# Patient Record
Sex: Female | Born: 1960 | Race: Black or African American | Hispanic: No | Marital: Married | Smoking: Never smoker
Health system: Southern US, Community
[De-identification: ages and names within clinical notes are randomized; demographics above are authoritative.]

## PROBLEM LIST (undated history)

## (undated) DIAGNOSIS — M25474 Effusion, right foot: Secondary | ICD-10-CM

## (undated) DIAGNOSIS — T7840XA Allergy, unspecified, initial encounter: Secondary | ICD-10-CM

## (undated) DIAGNOSIS — D219 Benign neoplasm of connective and other soft tissue, unspecified: Secondary | ICD-10-CM

## (undated) DIAGNOSIS — R42 Dizziness and giddiness: Secondary | ICD-10-CM

## (undated) DIAGNOSIS — G4700 Insomnia, unspecified: Secondary | ICD-10-CM

## (undated) DIAGNOSIS — M25472 Effusion, left ankle: Secondary | ICD-10-CM

## (undated) DIAGNOSIS — M25471 Effusion, right ankle: Secondary | ICD-10-CM

## (undated) DIAGNOSIS — M25475 Effusion, left foot: Secondary | ICD-10-CM

## (undated) DIAGNOSIS — K429 Umbilical hernia without obstruction or gangrene: Secondary | ICD-10-CM

## (undated) DIAGNOSIS — M25476 Effusion, unspecified foot: Secondary | ICD-10-CM

## (undated) DIAGNOSIS — E039 Hypothyroidism, unspecified: Secondary | ICD-10-CM

## (undated) DIAGNOSIS — D649 Anemia, unspecified: Secondary | ICD-10-CM

## (undated) DIAGNOSIS — I499 Cardiac arrhythmia, unspecified: Secondary | ICD-10-CM

## (undated) DIAGNOSIS — E079 Disorder of thyroid, unspecified: Secondary | ICD-10-CM

## (undated) DIAGNOSIS — M25473 Effusion, unspecified ankle: Secondary | ICD-10-CM

## (undated) HISTORY — PX: ABDOMINAL HYSTERECTOMY: SHX81

## (undated) HISTORY — PX: COLONOSCOPY: SHX174

## (undated) HISTORY — PX: OTHER SURGICAL HISTORY: SHX169

## (undated) HISTORY — DX: Anemia, unspecified: D64.9

## (undated) HISTORY — PX: DENTAL SURGERY: SHX609

## (undated) HISTORY — PX: HERNIA REPAIR: SHX51

## (undated) HISTORY — PX: DILATION AND CURETTAGE OF UTERUS: SHX78

## (undated) HISTORY — DX: Allergy, unspecified, initial encounter: T78.40XA

## (undated) HISTORY — DX: Disorder of thyroid, unspecified: E07.9

---

## 1999-09-21 ENCOUNTER — Other Ambulatory Visit: Admission: RE | Admit: 1999-09-21 | Discharge: 1999-09-21 | Payer: Self-pay | Admitting: Obstetrics and Gynecology

## 2000-08-09 ENCOUNTER — Emergency Department (HOSPITAL_COMMUNITY): Admission: EM | Admit: 2000-08-09 | Discharge: 2000-08-09 | Payer: Self-pay | Admitting: Emergency Medicine

## 2000-09-20 ENCOUNTER — Other Ambulatory Visit: Admission: RE | Admit: 2000-09-20 | Discharge: 2000-09-20 | Payer: Self-pay | Admitting: *Deleted

## 2001-11-09 ENCOUNTER — Other Ambulatory Visit: Admission: RE | Admit: 2001-11-09 | Discharge: 2001-11-09 | Payer: Self-pay | Admitting: Obstetrics and Gynecology

## 2002-11-11 ENCOUNTER — Other Ambulatory Visit: Admission: RE | Admit: 2002-11-11 | Discharge: 2002-11-11 | Payer: Self-pay | Admitting: Obstetrics and Gynecology

## 2006-12-19 ENCOUNTER — Ambulatory Visit (HOSPITAL_BASED_OUTPATIENT_CLINIC_OR_DEPARTMENT_OTHER): Admission: RE | Admit: 2006-12-19 | Discharge: 2006-12-19 | Payer: Self-pay | Admitting: Urology

## 2009-05-20 ENCOUNTER — Ambulatory Visit: Payer: Self-pay | Admitting: Internal Medicine

## 2009-05-20 DIAGNOSIS — E039 Hypothyroidism, unspecified: Secondary | ICD-10-CM | POA: Insufficient documentation

## 2009-05-20 DIAGNOSIS — R002 Palpitations: Secondary | ICD-10-CM | POA: Insufficient documentation

## 2009-05-21 ENCOUNTER — Encounter: Payer: Self-pay | Admitting: Internal Medicine

## 2009-06-10 ENCOUNTER — Ambulatory Visit: Payer: Self-pay

## 2010-06-20 LAB — CONVERTED CEMR LAB
ALT: 20 units/L (ref 0–35)
AST: 20 units/L (ref 0–37)
BUN: 8 mg/dL (ref 6–23)
Basophils Absolute: 0 10*3/uL (ref 0.0–0.1)
Bilirubin, Direct: 0 mg/dL (ref 0.0–0.3)
Calcium: 9.9 mg/dL (ref 8.4–10.5)
Creatinine, Ser: 0.7 mg/dL (ref 0.4–1.2)
Eosinophils Relative: 2.3 % (ref 0.0–5.0)
GFR calc non Af Amer: 115.69 mL/min (ref >60)
Glucose, Bld: 91 mg/dL (ref 70–99)
HCT: 32.2 % — ABNORMAL LOW (ref 36.0–46.0)
Lymphs Abs: 1.8 10*3/uL (ref 0.7–4.0)
Monocytes Absolute: 0.3 10*3/uL (ref 0.1–1.0)
Monocytes Relative: 6.2 % (ref 3.0–12.0)
Neutrophils Relative %: 58.1 % (ref 43.0–77.0)
Platelets: 218 10*3/uL (ref 150.0–400.0)
RDW: 15.8 % — ABNORMAL HIGH (ref 11.5–14.6)
TSH: 0.44 microintl units/mL (ref 0.35–5.50)
Total Bilirubin: 0.9 mg/dL (ref 0.3–1.2)
WBC: 5.3 10*3/uL (ref 4.5–10.5)

## 2010-10-05 NOTE — Op Note (Signed)
Sandra Yates, Sandra Yates                ACCOUNT NO.:  [id number]   MEDICAL RECORD NO.:  [id number]          PATIENT TYPE:  AMB   LOCATION:  [facility]                         FACILITY:  [facility]   PHYSICIAN:  Martina Sinner, MD DATE OF BIRTH:  01/18/1961   DATE OF PROCEDURE:  12/19/2006  DATE OF DISCHARGE:                               OPERATIVE REPORT   PREOP DIAGNOSIS:  Pelvic pain.   POSTOPERATIVE DIAGNOSES:  1. Pelvic pain.  2. Interstitial cystitis.  3. Mild urethral stenosis.   SURGERY:  1. Cystoscopy.  2. Hydrodistention bladder installation therapy.  3. Urethral dilation.   HISTORY:  Ms. [name] has urinary frequency and mild stress and  urge incontinence.  Based upon her urodynamics; and upon re-questioning  her, I felt that she may have interstitial cystitis.  Her symptoms are  worse around the time of her menstrual cycle.  There is a question  whether or not her large uterus with a fibroid was the cause of some of  her symptoms.   Today, she underwent cystoscopy and bladder hydrodistention.   DESCRIPTION OF PROCEDURE:  Today I examined her with a 21-French  cystoscope.  Even with the obturator, I could not initially insert the  scope through the urethral meatus.  In retrospect, I had trouble the  office with the same.  I gently dilated her from 16-to-22-French.  The  cystoscope with obturator then went in very nicely.   I then inspected the bladder; and the bladder mucosa and trigone were  normal.  There was no stitch, foreign body, or carcinoma.  Surprisingly,  I could only hydrodistend her to approximately 450 mL.  I emptied the  bladder, and reexamined her.  There were diffuse glomerulations  throughout the entire bladder.  There is no ulcers.  She had a little  bit of bladder bleeding.  Her bladder was emptied.   I then instilled 25 mL of 0.5% Marcaine in combination with 400 mg of  peridium.   The patient was given.  Preoperative antibiotics.  The patient was  taken  to recovery room.   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  I do not think she  should have a hysterectomy at this point in time for these symptoms.  It  may be an aggravating factor; but, I think, for her to have a  hysterectomy there should be other reasons to have one.  I will check to  see whether or not behavioral therapy; and Vesicare helped her which was  prescribed on November 06, 2006.  Detrol given for a month helped several  months ago, but the effect gradually wore off.   I am going to aggressively manage Ms. [name]' symptoms with behavioral  medical therapy and interstitial cystitis therapy.  I will send a known  to Dr. Richardean Sale.           ______________________________  Martina Sinner, MD  Electronically Signed     SAM/MEDQ  D:  12/19/2006  T:  12/19/2006  Job:  [phone]  cc:   Richardean Sale, M.D.  Fax: [phone]

## 2011-03-07 ENCOUNTER — Ambulatory Visit (INDEPENDENT_AMBULATORY_CARE_PROVIDER_SITE_OTHER): Payer: Self-pay

## 2011-03-07 ENCOUNTER — Inpatient Hospital Stay (INDEPENDENT_AMBULATORY_CARE_PROVIDER_SITE_OTHER)
Admission: RE | Admit: 2011-03-07 | Discharge: 2011-03-07 | Disposition: A | Discharge status: Home / Self Care | Payer: Self-pay | Source: Ambulatory Visit | Attending: Family Medicine | Admitting: Family Medicine

## 2011-03-07 DIAGNOSIS — M79609 Pain in unspecified limb: Secondary | ICD-10-CM

## 2011-03-07 LAB — POCT HEMOGLOBIN-HEMACUE
Hemoglobin: 8.8 — ABNORMAL LOW
Operator id: 268271

## 2011-03-07 LAB — URIC ACID: Uric Acid, Serum: 4.6 mg/dL (ref 2.4–7.0)

## 2011-03-07 LAB — POCT PREGNANCY, URINE: Operator id: 268271

## 2012-03-07 ENCOUNTER — Ambulatory Visit (INDEPENDENT_AMBULATORY_CARE_PROVIDER_SITE_OTHER): Payer: PRIVATE HEALTH INSURANCE | Admitting: Family Medicine

## 2012-03-07 ENCOUNTER — Encounter: Payer: Self-pay | Admitting: Family Medicine

## 2012-03-07 VITALS — BP 125/82 | HR 90 | Temp 97.9°F | Wt 134.0 lb

## 2012-03-07 DIAGNOSIS — D649 Anemia, unspecified: Secondary | ICD-10-CM

## 2012-03-07 DIAGNOSIS — Z13220 Encounter for screening for lipoid disorders: Secondary | ICD-10-CM

## 2012-03-07 DIAGNOSIS — Z833 Family history of diabetes mellitus: Secondary | ICD-10-CM

## 2012-03-07 DIAGNOSIS — N926 Irregular menstruation, unspecified: Secondary | ICD-10-CM

## 2012-03-07 DIAGNOSIS — E039 Hypothyroidism, unspecified: Secondary | ICD-10-CM

## 2012-03-07 DIAGNOSIS — Z87410 Personal history of cervical dysplasia: Secondary | ICD-10-CM

## 2012-03-07 MED ORDER — LEVOTHYROXINE SODIUM 50 MCG PO TABS: 50.0000 ug | ORAL_TABLET | Freq: Every day | ORAL | Status: DC

## 2012-03-07 NOTE — Patient Instructions (Addendum)
Sent refill for levothyroxine to your pharmacy  Will check your hemoglobin today  Follow-up in 6 weeks for annual gynecological exam and fasting bloodwork  (fasting is no food or drink except water and medicines for 6-8 hours)  Nice to meet you!

## 2012-03-07 NOTE — Assessment & Plan Note (Signed)
Agree likley contributed by fibroids and perimenopausal.  WIll restart TSH supplemetation, follow-up in 6 wek sat her annual gyn exam

## 2012-03-07 NOTE — Assessment & Plan Note (Signed)
Will refill at her stable dose of levothyroxine 50 mcg.  Will recheck in 6 weeks

## 2012-03-07 NOTE — Assessment & Plan Note (Signed)
11.9.  Continue once daily iron supplement.

## 2012-03-07 NOTE — Progress Notes (Signed)
  Subjective:    Patient ID: Sandra Yates, female    DOB: 1960/12/26, 51 y.o.   MRN: [id number]  HPI"pronounced "Sha-lay"  Here to establish as new patient.  Hypothyroidism:  Has radioiodine therapy in 20's.  Has been on stable levothyroxine dose of 50 mcg for years.  Has been out and needed refill.  Has not taken for 1 month.  Has had some dizziness and skin changes since then.  Anemia:  Notes was found to be anemic earlier this year.  Has iron supplements, taking occasionally.  Reports heavy periods every 21 days.  States previous physician states was likely due to fibroids in perimenopause.    I have reviewed patient's  PMH, FH, and Social history and Medications as related to this visit.  Patient Information Form: Screening and ROS  AUDIT-C Score: 0 Do you feel safe in relationships? yes PHQ-2:negative  Review of Symptoms  General:  Negative for nexplained weight loss, fever Skin: Negative for new or changing mole, sore that won't heal HEENT: Negative for trouble hearing, trouble seeing, ringing in ears, mouth sores, hoarseness, change in voice, dysphagia. CV:  Negative for chest pain, dyspnea, edema, palpitations Resp: Negative for cough, dyspnea, hemoptysis GI: Negative for nausea, vomiting, diarrhea, constipation, abdominal pain, melena, hematochezia. GU: Negative for dysuria, incontinence, urinary hesitance, hematuria, vaginal or penile discharge, polyuria, sexual difficulty, lumps in testicle or breasts MSK: Negative for muscle cramps or aches, joint pain or swelling Neuro: Negative for headaches, weakness, numbness, dizziness, passing out/fainting Psych: Negative for depression, anxiety, memory problems  Positive for dizziness, muscle cramps, vision changes. Review of Systems see HPI   Objective:   Physical Exam GEN: Alert & Oriented, No acute distress CV:  Regular Rate & Rhythm, no murmur Respiratory:  Normal work of breathing, CTAB Abd:  + BS, soft, no tenderness  to palpation Ext: no pre-tibial edema        Assessment & Plan:

## 2012-04-05 ENCOUNTER — Other Ambulatory Visit (HOSPITAL_COMMUNITY)
Admission: RE | Admit: 2012-04-05 | Discharge: 2012-04-05 | Disposition: A | Discharge status: Home / Self Care | Payer: PRIVATE HEALTH INSURANCE | Source: Ambulatory Visit | Attending: Family Medicine | Admitting: Family Medicine

## 2012-04-05 ENCOUNTER — Ambulatory Visit (INDEPENDENT_AMBULATORY_CARE_PROVIDER_SITE_OTHER): Payer: PRIVATE HEALTH INSURANCE | Admitting: Family Medicine

## 2012-04-05 ENCOUNTER — Other Ambulatory Visit (HOSPITAL_COMMUNITY)
Admission: RE | Admit: 2012-04-05 | Discharge: 2012-04-05 | Disposition: A | Discharge status: Home / Self Care | Payer: PRIVATE HEALTH INSURANCE | Source: Ambulatory Visit | Attending: Emergency Medicine | Admitting: Emergency Medicine

## 2012-04-05 ENCOUNTER — Encounter: Payer: Self-pay | Admitting: Family Medicine

## 2012-04-05 VITALS — BP 118/86 | HR 105 | Ht 66.0 in | Wt 138.0 lb

## 2012-04-05 DIAGNOSIS — Z01419 Encounter for gynecological examination (general) (routine) without abnormal findings: Secondary | ICD-10-CM | POA: Insufficient documentation

## 2012-04-05 DIAGNOSIS — E039 Hypothyroidism, unspecified: Secondary | ICD-10-CM

## 2012-04-05 DIAGNOSIS — N760 Acute vaginitis: Secondary | ICD-10-CM

## 2012-04-05 DIAGNOSIS — Z124 Encounter for screening for malignant neoplasm of cervix: Secondary | ICD-10-CM

## 2012-04-05 DIAGNOSIS — Z113 Encounter for screening for infections with a predominantly sexual mode of transmission: Secondary | ICD-10-CM | POA: Insufficient documentation

## 2012-04-05 DIAGNOSIS — Z1151 Encounter for screening for human papillomavirus (HPV): Secondary | ICD-10-CM | POA: Insufficient documentation

## 2012-04-05 NOTE — Patient Instructions (Addendum)
Due for mammogram- see info  Will check thyroid level today  [email or web address]  Follow-up yearly

## 2012-04-05 NOTE — Progress Notes (Signed)
  Subjective:    Patient ID: Sandra Yates, female    DOB: [birth year], 51 y.o.   MRN: [id number]  HPI  Annual Gynecological Exam  Wt Readings from Last 3 Encounters:  04/05/12 138 lb (62.596 kg)  03/07/12 134 lb (60.782 kg)  05/20/09 134 lb 4 oz (60.895 kg)   Last period: 02/26/2012 Regular periods: peri-meniopausal Heavy bleeding: no  Sexually active: yes Birth control or hormonal therapy: vasectomy Hx of STD: Patient desires STD screening Dyspareunia:yes- no pressure, some dryness, ad feels like vaginal skin pulling, uses some lubrication Hot flashes: No Vaginal discharge: No Dysuria:No   Last mammogram: never had one Breast mass or concerns: No  Last Pap:  normal since then History of abnormal pap: many years ago - 20-25 years ago  FH of breast, uterine, ovarian, colon cancer: No    Review of Systems    see HPI Objective:   Physical Exam GEN: Alert & Oriented, No acute distress CV:  Regular Rate & Rhythm, no murmur Respiratory:  Normal work of breathing, CTAB Abd:  + BS, soft, no tenderness to palpation Ext: no pre-tibial edema Pelvic Exam:        External: normal female genitalia without lesions or masses        Vagina: normal without lesions or masses- vagina looks well estrogenized        Cervix: normal without lesions or masses        Adnexa: normal bimanual exam without masses or fullness        Uterus: normal by palpation        Pap smear: performed Breast: wnl       Assessment & Plan:  Preventive services reviewed- patient given info on mammogram.  Hypothyroidism: will check today 6 weeks after re-starting Follow-up annually

## 2012-04-09 ENCOUNTER — Other Ambulatory Visit: Payer: PRIVATE HEALTH INSURANCE

## 2012-04-09 DIAGNOSIS — E039 Hypothyroidism, unspecified: Secondary | ICD-10-CM

## 2012-04-09 DIAGNOSIS — D649 Anemia, unspecified: Secondary | ICD-10-CM

## 2012-04-09 DIAGNOSIS — Z833 Family history of diabetes mellitus: Secondary | ICD-10-CM

## 2012-04-09 DIAGNOSIS — Z13220 Encounter for screening for lipoid disorders: Secondary | ICD-10-CM

## 2012-04-09 LAB — COMPREHENSIVE METABOLIC PANEL
ALT: 13 U/L (ref 0–35)
CO2: 29 mEq/L (ref 19–32)
Creat: 0.65 mg/dL (ref 0.50–1.10)
Total Bilirubin: 0.7 mg/dL (ref 0.3–1.2)

## 2012-04-09 LAB — CBC
HCT: 37.6 % (ref 36.0–46.0)
Hemoglobin: 12.4 g/dL (ref 12.0–15.0)
MCV: 90.2 fL (ref 78.0–100.0)
RBC: 4.17 MIL/uL (ref 3.87–5.11)
RDW: 15.7 % — ABNORMAL HIGH (ref 11.5–15.5)
WBC: 4.6 10*3/uL (ref 4.0–10.5)

## 2012-04-09 LAB — LIPID PANEL
HDL: 47 mg/dL (ref >39)
LDL Cholesterol: 129 mg/dL — ABNORMAL HIGH (ref 0–99)
Total CHOL/HDL Ratio: 4.3 Ratio
VLDL: 27 mg/dL (ref 0–40)

## 2012-04-09 LAB — TSH: TSH: 1.176 u[IU]/mL (ref 0.350–4.500)

## 2012-04-09 NOTE — Progress Notes (Signed)
TSH,CMP,FLP AND CBC DONE TODAY Sandra Yates 

## 2012-04-10 ENCOUNTER — Encounter: Payer: Self-pay | Admitting: Family Medicine

## 2012-07-07 ENCOUNTER — Other Ambulatory Visit: Payer: Self-pay

## 2012-10-05 ENCOUNTER — Ambulatory Visit (INDEPENDENT_AMBULATORY_CARE_PROVIDER_SITE_OTHER): Payer: PRIVATE HEALTH INSURANCE | Admitting: Family Medicine

## 2012-10-05 ENCOUNTER — Encounter: Payer: Self-pay | Admitting: Family Medicine

## 2012-10-05 VITALS — BP 117/80 | HR 96 | Ht 66.0 in | Wt 140.0 lb

## 2012-10-05 DIAGNOSIS — D649 Anemia, unspecified: Secondary | ICD-10-CM

## 2012-10-05 DIAGNOSIS — E039 Hypothyroidism, unspecified: Secondary | ICD-10-CM

## 2012-10-05 LAB — TSH: TSH: 4.918 u[IU]/mL — ABNORMAL HIGH (ref 0.350–4.500)

## 2012-10-05 NOTE — Assessment & Plan Note (Signed)
Resolved at last check.  Advised may discontinue iron supplements

## 2012-10-05 NOTE — Patient Instructions (Addendum)
Good to see you-  You are doing great!  Will send you letter with normal thyroid results, will call if abnormal  Follow-up yearly- last physical was 04/05/12

## 2012-10-05 NOTE — Assessment & Plan Note (Addendum)
Will check TSh today  Update:  tsh elevated.  Will increase from 50 mcg to 75 mcg.  Recheck in 8 weeks

## 2012-10-05 NOTE — Progress Notes (Signed)
  Subjective:    Patient ID: Sandra Yates, female    DOB: February 27, 1961, 52 y.o.   MRN: [id number]  HPI 52 yo here for follow-up of hypothyroidism  radioiodine ablation in 50's.  Has been well controlled on 50 mcg daily.  Received notice from pharmacy of changing manufacturer.  Comes in for recheck  She bring sup symptoms of occ lightheadedness when not eating for long periods.  Also with some bi alteral finger swelling, not joints.  Not very distressing.  Self resolved.   Review of Systems  NO Palpations, constipation, fatigue.     Objective:   Physical Exam GEN: Alert & Oriented, No acute distress CV:  Regular Rate & Rhythm, no murmur Respiratory:  Normal work of breathing, CTAB Est: no hand swelling        Assessment & Plan:  Encouraged continued healthy lifestyle, eat regularly and increase hydration

## 2012-10-08 ENCOUNTER — Telehealth: Payer: Self-pay | Admitting: *Deleted

## 2012-10-08 MED ORDER — LEVOTHYROXINE SODIUM 75 MCG PO TABS: 75.0000 ug | ORAL_TABLET | Freq: Every day | ORAL | Status: DC

## 2012-10-08 NOTE — Telephone Encounter (Signed)
Pt. Notified of low TSH level and told new dosage of medication called in.  Pt also told need recheck of TSH in 8 weeks after starting new dose.  Pt. Verbalized understanding.  Emilie Rutter, Darlyne Russian

## 2012-10-08 NOTE — Telephone Encounter (Signed)
Message copied by Radene Ou on Mon Oct 08, 2012  9:57 AM ------      Message from: Macy Mis      Created: Mon Oct 08, 2012  8:34 AM       Please inform patient her thyroid was low and that I sent in a new prescription for the next higher dose.  She should return for a lab appointment in 8 weeks to recheck thyroid. ------

## 2012-10-08 NOTE — Addendum Note (Signed)
Addended by: Macy Mis on: 10/08/2012 08:34 AM   Modules accepted: Orders

## 2012-12-21 ENCOUNTER — Encounter: Payer: Self-pay | Admitting: Family Medicine

## 2012-12-21 ENCOUNTER — Ambulatory Visit (INDEPENDENT_AMBULATORY_CARE_PROVIDER_SITE_OTHER): Payer: PRIVATE HEALTH INSURANCE | Admitting: Family Medicine

## 2012-12-21 VITALS — BP 123/84 | HR 80 | Temp 98.2°F | Wt 141.7 lb

## 2012-12-21 DIAGNOSIS — E039 Hypothyroidism, unspecified: Secondary | ICD-10-CM

## 2012-12-21 NOTE — Patient Instructions (Addendum)
Sandra Yates,  Thank you very much for coming in to see me today. We will check her TSH today. I will call you with results and we will discuss dose adjustments as needed.  For now continue current medications as prescribed.  Dr. Armen Pickup

## 2012-12-21 NOTE — Assessment & Plan Note (Signed)
A: Compliant with Synthroid. Increased hot flashes could be due to Synthroid dose being too high versus normal changes of menopause. P: Repeat TSH and adjust dose as needed Patient advised continue Synthroid daily for now.

## 2012-12-21 NOTE — Progress Notes (Signed)
Subjective:     Patient ID: Sandra Yates, female   DOB: [birth year], 52 y.o.   MRN: [id number]  HPI 52 year old female history of hypothyroidism status post radioiodine ablation presents for followup:  Hypothyroidism: Patient is compliant with Synthroid 75 mcg daily. She started taking every other day for the past week. She admits to increased hot flashes. She denies chest pain, palpitations, nausea, vomiting, diarrhea, edema. She reports stable weight. She states this is chronic dry skin since her reactive iodine ablation 30 years ago.  Soc Hx: denies tobacco, ETOH, IDU  Review of Systems As per history of present illness    Objective:   Physical Exam BP 123/84  Pulse 80  Temp(Src) 98.2 F (36.8 C) (Oral)  Wt 141 lb 11.2 oz (64.275 kg)  BMI 22.88 kg/m2 General appearance: alert, cooperative and no distress Neck: no adenopathy, no carotid bruit, no JVD, supple, symmetrical, trachea midline and thyroid: absent Lungs: clear to auscultation bilaterally Heart: regular rate and rhythm, S1, S2 normal, no murmur, click, rub or gallop Extremities: extremities normal, atraumatic, no cyanosis or edema    Assessment and Plan:

## 2012-12-25 ENCOUNTER — Telehealth: Payer: Self-pay | Admitting: Family Medicine

## 2012-12-25 DIAGNOSIS — E039 Hypothyroidism, unspecified: Secondary | ICD-10-CM

## 2012-12-25 MED ORDER — LEVOTHYROXINE SODIUM 75 MCG PO TABS: ORAL_TABLET | ORAL | Status: DC

## 2012-12-25 NOTE — Telephone Encounter (Signed)
Called patient. TSH is slightly low, meaning slightly over treated. Patient has been taking synthroid every other day and experiencing less hot flashes.  Plan: synthroid at current dose every MWFSat. Repeat TSH in 8 weeks. Patient agrees with plan and voices understanding.

## 2013-03-15 ENCOUNTER — Ambulatory Visit (INDEPENDENT_AMBULATORY_CARE_PROVIDER_SITE_OTHER): Payer: PRIVATE HEALTH INSURANCE | Admitting: Family Medicine

## 2013-03-15 ENCOUNTER — Encounter: Payer: Self-pay | Admitting: Family Medicine

## 2013-03-15 VITALS — BP 117/80 | HR 72 | Temp 98.4°F | Wt 142.0 lb

## 2013-03-15 DIAGNOSIS — R030 Elevated blood-pressure reading, without diagnosis of hypertension: Secondary | ICD-10-CM

## 2013-03-15 DIAGNOSIS — E039 Hypothyroidism, unspecified: Secondary | ICD-10-CM

## 2013-03-15 DIAGNOSIS — IMO0001 Reserved for inherently not codable concepts without codable children: Secondary | ICD-10-CM | POA: Insufficient documentation

## 2013-03-15 NOTE — Assessment & Plan Note (Signed)
Repeat TSH today. patient taking daily synthroid instead of every other day. I suspect low TSH will likely decrease synthroid to 50 mcg.

## 2013-03-15 NOTE — Assessment & Plan Note (Signed)
Elevated on presentation. Repeat normal No intervention at this time.

## 2013-03-15 NOTE — Patient Instructions (Signed)
Sandra Yates,  Thank you for coming in today.  I will be in touch regarding your TSH and whether or not a synthroid dose adjustment is needed.   Screening: 1. Colonoscopy 2. Mammogram.   Dr. Armen Pickup

## 2013-03-15 NOTE — Progress Notes (Signed)
  Subjective:    Patient ID: Sandra Yates, female    DOB: 03/28/1961, 52 y.o.   MRN: [id number]  HPI 52 year old female presents for follow visit to discuss the following:  #1 hypothyroidism: Patient is compliant with Synthroid. She is taking 75 g once daily. She denies hot flashes. She had palpitations but this resolved about 2 weeks ago she feels they were related to anxiety about family member. She has dry skin. She denies chest pain.  #2 elevated BP: just today in office. No HA, vision changes, CP, SOB.    health maintenance: Patient declines flu shot today. She is due for mammogram and colonoscopy.  Review of Systems As per HPI     Objective:   Physical Exam BP 117/80  Pulse 72  Temp(Src) 98.4 F (36.9 C) (Oral)  Wt 142 lb (64.411 kg)  BMI 22.93 kg/m2  LMP 03/10/2013 General appearance: alert, cooperative and no distress Eyes: conjunctivae/corneas clear. PERRL, EOM's intact.  Neck: no adenopathy, no carotid bruit, no JVD, supple, symmetrical, trachea midline and thyroid: absent Lungs: clear to auscultation bilaterally Heart: regular rate and rhythm, S1, S2 normal, no murmur, click, rub or gallop Extremities: extremities normal, atraumatic, no cyanosis or edema Skin: xerotic. No rash.    Assessment & Plan:

## 2013-03-19 ENCOUNTER — Telehealth: Payer: Self-pay | Admitting: *Deleted

## 2013-03-19 MED ORDER — LEVOTHYROXINE SODIUM 50 MCG PO TABS: 50.0000 ug | ORAL_TABLET | Freq: Every day | ORAL | Status: DC

## 2013-03-19 NOTE — Addendum Note (Signed)
Addended by: Dessa Phi on: 03/19/2013 11:34 AM   Modules accepted: Orders, Medications

## 2013-03-19 NOTE — Telephone Encounter (Signed)
Pt is aware.  Will call when gets close to being done with medication for a recheck.  Samya Siciliano,CMA

## 2013-03-19 NOTE — Telephone Encounter (Signed)
Message copied by Henri Medal on Tue Mar 19, 2013 11:47 AM ------      Message from: Dessa Phi      Created: Tue Mar 19, 2013 11:35 AM       Please inform patient.       Synthroid dose decreased to 50 mcg daily due to low TSH.       ------

## 2013-05-14 ENCOUNTER — Telehealth: Payer: Self-pay | Admitting: Family Medicine

## 2013-05-14 DIAGNOSIS — E039 Hypothyroidism, unspecified: Secondary | ICD-10-CM

## 2013-05-14 MED ORDER — LEVOTHYROXINE SODIUM 50 MCG PO TABS: 50.0000 ug | ORAL_TABLET | Freq: Every day | ORAL | Status: DC

## 2013-05-14 NOTE — Telephone Encounter (Signed)
Refill sent in. Patient will need to have repeat TSH drawn within the next 30 days.

## 2013-05-14 NOTE — Telephone Encounter (Signed)
Pt called and needs a refill of her thyroid medication sent in. She will be out on Thursday. jw

## 2013-05-14 NOTE — Telephone Encounter (Signed)
Pt made an appt for Monday 05/20/13.  Can you please place an order?  Kawhi Diebold,CMA

## 2013-05-14 NOTE — Addendum Note (Signed)
Addended by: Dessa Phi on: 05/14/2013 01:09 PM   Modules accepted: Orders

## 2013-05-14 NOTE — Telephone Encounter (Signed)
Order placed

## 2013-05-20 ENCOUNTER — Other Ambulatory Visit: Payer: PRIVATE HEALTH INSURANCE

## 2013-05-20 DIAGNOSIS — E039 Hypothyroidism, unspecified: Secondary | ICD-10-CM

## 2013-05-20 NOTE — Progress Notes (Signed)
TSH DONE TODAY Sandra Yates 

## 2013-05-21 ENCOUNTER — Other Ambulatory Visit: Payer: Self-pay | Admitting: Family Medicine

## 2013-05-21 ENCOUNTER — Encounter: Payer: Self-pay | Admitting: *Deleted

## 2013-05-21 DIAGNOSIS — E039 Hypothyroidism, unspecified: Secondary | ICD-10-CM

## 2013-05-21 MED ORDER — LEVOTHYROXINE SODIUM 50 MCG PO TABS: 50.0000 ug | ORAL_TABLET | Freq: Every day | ORAL | Status: DC

## 2013-06-14 ENCOUNTER — Telehealth: Payer: Self-pay | Admitting: Family Medicine

## 2013-06-14 NOTE — Telephone Encounter (Signed)
Pt called and needs a refill on her Levothyroxine called since she will be out on Sunday. jw

## 2013-06-17 ENCOUNTER — Telehealth: Payer: Self-pay | Admitting: Family Medicine

## 2013-06-17 NOTE — Telephone Encounter (Signed)
Pt called and needed a status update on her request for a refill on levothyroxine. jw

## 2013-06-17 NOTE — Telephone Encounter (Signed)
Patient had 11 refills sent when med with refilled in December 2014. Please inform patient to call her pharmacy for refill request.

## 2013-06-17 NOTE — Telephone Encounter (Signed)
Pt informed,  She will check by pharmamcy. [place], Sandra Yates

## 2013-10-16 ENCOUNTER — Ambulatory Visit (INDEPENDENT_AMBULATORY_CARE_PROVIDER_SITE_OTHER): Payer: PRIVATE HEALTH INSURANCE | Admitting: Family Medicine

## 2013-10-16 ENCOUNTER — Encounter: Payer: Self-pay | Admitting: Family Medicine

## 2013-10-16 VITALS — BP 128/85 | HR 89 | Temp 98.0°F | Ht 66.0 in | Wt 137.0 lb

## 2013-10-16 DIAGNOSIS — N926 Irregular menstruation, unspecified: Secondary | ICD-10-CM

## 2013-10-16 DIAGNOSIS — L0293 Carbuncle, unspecified: Secondary | ICD-10-CM | POA: Insufficient documentation

## 2013-10-16 DIAGNOSIS — L0292 Furuncle, unspecified: Secondary | ICD-10-CM

## 2013-10-16 DIAGNOSIS — D259 Leiomyoma of uterus, unspecified: Secondary | ICD-10-CM | POA: Insufficient documentation

## 2013-10-16 DIAGNOSIS — E039 Hypothyroidism, unspecified: Secondary | ICD-10-CM

## 2013-10-16 DIAGNOSIS — Z0000 Encounter for general adult medical examination without abnormal findings: Secondary | ICD-10-CM

## 2013-10-16 LAB — TSH: TSH: 1.082 u[IU]/mL (ref 0.350–4.500)

## 2013-10-16 NOTE — Progress Notes (Signed)
   Subjective:    Patient ID: Sandra Yates, female    DOB: 07-28-1960, 53 y.o.   MRN: [id number] CC: check thyroid  HPI 53 yo F with hxof hypothyrodism seconday to radioactive iodine therapy presents for SD visit for thyroid check. She reports two weeks of hot flashes associated with her last menstrual period which was two weeks long. Menses occur q 21 days, moderate flow, no significant pain. She has known uterine fibroid. When hot flashes increased she decreased synthroid dose to every other day which decreased hot flashes frequency. Denies chest pain and palpitations.   Reports recurrent boils in vaginal and anal area during menses. Using OTC ichthammol ointment which helps.   HM: due for screening mammogram and colonoscopy  Review of Systems As per HPI     Objective:   Physical Exam BP 128/85  Pulse 89  Temp(Src) 98 F (36.7 C) (Oral)  Ht 5\' 6"  (1.676 m)  Wt 137 lb (62.143 kg)  BMI 22.12 kg/m2  LMP 09/24/2013 General appearance: alert, cooperative and no distress Abdomen: palpable suprapubic mass about 4 cm below umbilicus  Extremities: extremities normal, atraumatic, no cyanosis or edema    Assessment & Plan:

## 2013-10-16 NOTE — Assessment & Plan Note (Signed)
A: patient taking QOD dose x 2 weeks due to increased hot flashes P: check TSH doe synthroid accordingly

## 2013-10-16 NOTE — Assessment & Plan Note (Signed)
MRSA swab treat if positive

## 2013-10-16 NOTE — Assessment & Plan Note (Signed)
A: now regular with hot flashes P:  reassurance Check [facility]

## 2013-10-16 NOTE — Assessment & Plan Note (Signed)
Asymptomatic except for last menses longer than usual Patient due for pap next year. Plan for repeat pelvic U/S to evaluate fibroid if not much smaller at that time.

## 2013-10-16 NOTE — Assessment & Plan Note (Signed)
Due for screening colonoscopy and mammogram. Recommended both.

## 2013-10-16 NOTE — Patient Instructions (Signed)
Sandra Yates,  Thank you for coming in today. Please schedule screening mammogram at your earliest convenience. I have placed a GI referral for screening colonoscopy.   I will be in touch with lab results.  Plan for repeat pap smear along with f/u pelvic ultrasound next year.  Dr. Adrian Blackwater

## 2013-10-17 LAB — FOLLICLE STIMULATING HORMONE: FSH: 62.1 m[IU]/mL

## 2013-10-18 ENCOUNTER — Telehealth: Payer: Self-pay | Admitting: Family Medicine

## 2013-10-18 DIAGNOSIS — E039 Hypothyroidism, unspecified: Secondary | ICD-10-CM

## 2013-10-18 LAB — NASAL CULTURE (N/P): ORGANISM ID, BACTERIA: NORMAL

## 2013-10-18 MED ORDER — LEVOTHYROXINE SODIUM 25 MCG PO TABS: 25.0000 ug | ORAL_TABLET | Freq: Every day | ORAL | Status: DC

## 2013-10-18 NOTE — Telephone Encounter (Signed)
Normal TSH- recommend decrease synthroid to 25 mcg daily, repeat in 6 weeks as it takes 6-8 weeks following dose change to see a response in TSH.   [place] in postmenopausal range anticipate menses will stop within the next 1-2, anticipate continued hot flashes. Plan for pelvic exam and pelvic US next year to evaluate fibroid. Patient may see me in office if hot flashes becomes bothersome to discuss treatment options.  Nasal culture negative.

## 2013-11-26 ENCOUNTER — Encounter: Payer: Self-pay | Admitting: Family Medicine

## 2014-01-17 ENCOUNTER — Ambulatory Visit (INDEPENDENT_AMBULATORY_CARE_PROVIDER_SITE_OTHER): Payer: PRIVATE HEALTH INSURANCE | Admitting: Family Medicine

## 2014-01-17 ENCOUNTER — Encounter: Payer: Self-pay | Admitting: Family Medicine

## 2014-01-17 VITALS — BP 108/80 | HR 88 | Ht 66.0 in | Wt 145.0 lb

## 2014-01-17 DIAGNOSIS — G4700 Insomnia, unspecified: Secondary | ICD-10-CM | POA: Insufficient documentation

## 2014-01-17 DIAGNOSIS — E039 Hypothyroidism, unspecified: Secondary | ICD-10-CM

## 2014-01-17 DIAGNOSIS — Z0000 Encounter for general adult medical examination without abnormal findings: Secondary | ICD-10-CM

## 2014-01-17 MED ORDER — LEVOTHYROXINE SODIUM 25 MCG PO TABS: 25.0000 ug | ORAL_TABLET | Freq: Every day | ORAL | Status: DC

## 2014-01-17 NOTE — Patient Instructions (Signed)
Thank you for coming to the clinic today. It was nice seeing you.  For your thyroid, we will recheck your TSH today. I will also refill your synthroid. If your TSH is abnormal, we will call you about adjusting the dose.  For your insomnia, one thing you can do is eliminate is caffeine after lunch. It is also important to eliminate bright lights and screens about 30 minutes to an hour before bed.  See you again in 6 months for a thyroid recheck, unless the one we obtain today is abnormal.

## 2014-01-17 NOTE — Progress Notes (Signed)
Patient ID: Sandra Yates, female   DOB: 1960-09-24, 53 y.o.   MRN: [id number]    Sandra Yates is a 53 y.o. female who presents to the [facility] today for follow up for hypothryoidism. Her concerns today include:  HPI:  Hypothyroidism Changed to 64mcg synthroid 3 months ago. Reports previously being overtreated for hypothyroidism and was experiencing abnormal uterine bleeding and hot flashes. Reports that these symptoms have abated since being placed on a lower dose. Now reports having some increased appetite and weight gain. Also reports having thinning hair and dry skin that are baseline. Palpitations are improved since changing doses.   Insomnia, new problem Having trouble falling asleep for at least the past 5 years. Occurs frequently and is not improving. Endorses drinking caffeinated soda all day long and sleeping with the TV on. No napping during the day. Sometimes eats large meals soon before bed time. Has tried Nyquil with minimal benefit.  Health Maintenance: Needs Mammogram and Colonoscopy  ROS: As per HPI, otherwise all systems reviewed and are negative.  Past Medical History - Reviewed and updated Patient Active Problem List   Diagnosis Date Noted  . Insomnia 01/17/2014  . Uterine fibroid 10/16/2013  . Recurrent boils 10/16/2013  . Health care maintenance 10/16/2013  . History of cervical dysplasia 03/07/2012  . Irregular menses 03/07/2012  . HYPOTHYROIDISM 05/20/2009    Medications- reviewed and updated Current Outpatient Prescriptions  Medication Sig Dispense Refill  . Biotin 5000 MCG CAPS Take by mouth.      . ferrous sulfate 325 (65 FE) MG tablet Take 325 mg by mouth daily with breakfast.      . Flaxseed, Linseed, 1000 MG CAPS Take 1 capsule by mouth 3 (three) times daily.      . Ichthammol 20 % OINT Apply topically.      Marland Kitchen levothyroxine (SYNTHROID, LEVOTHROID) 25 MCG tablet Take 1 tablet (25 mcg total) by mouth daily.  30 tablet  2  . Multiple Vitamins-Minerals  (WOMENS MULTI VITAMIN & MINERAL PO) Take 1 tablet by mouth 2 (two) times daily.       No current facility-administered medications for this visit.    Objective: Physical Exam: BP 108/80  Pulse 88  Ht 5\' 6"  (1.676 m)  Wt 145 lb (65.772 kg)  BMI 23.41 kg/m2  LMP 01/10/2014  Gen: NAD, resting comfortably CV: RRR with no murmurs appreciated Lungs: NWOB. CTAB with no crackles, wheezes, or rhonchi Abdomen: Normal bowel sounds present. Soft, Nontender, Nondistended. Ext: no edema Skin: warm, dry Neuro: grossly normal, moves all extremities  No results found for this or any previous visit (from the past 72 hour(s)).  A/P: See problem list  HYPOTHYROIDISM Doing well on 84mcg synthroid. Refilled synthroid today. Will recheck TSH today. If normal, will plan for repeat in 6 months. If abnormal, will adjust dose of synthroid.   Health care maintenance Recommended screening colonoscopy and mammogram.  Insomnia Likely due to poor sleep hygiene. Discussed good sleep hygiene habits including eliminating caffeine after lunch and turning off bright lights and screens at least 30 minutes to an hour before bed. Handout given.    Orders Placed This Encounter  Procedures  . TSH    Meds ordered this encounter  Medications  . levothyroxine (SYNTHROID, LEVOTHROID) 25 MCG tablet    Sig: Take 1 tablet (25 mcg total) by mouth daily.    Dispense:  30 tablet    Refill:  2     Atom Solivan M. Jerline Pain, [place]  Family Medicine Resident PGY-1 01/17/2014 3:15 PM

## 2014-01-17 NOTE — Assessment & Plan Note (Signed)
Recommended screening colonoscopy and mammogram.

## 2014-01-17 NOTE — Assessment & Plan Note (Addendum)
New problem. Likely due to poor sleep hygiene. Discussed good sleep hygiene habits including eliminating caffeine after lunch and turning off bright lights and screens at least 30 minutes to an hour before bed. Handout given.

## 2014-01-17 NOTE — Assessment & Plan Note (Signed)
Doing well on 57mcg synthroid. Refilled synthroid today. Will recheck TSH today. If normal, will plan for repeat in 6 months. If abnormal, will adjust dose of synthroid.

## 2014-01-18 LAB — TSH: TSH: 4.742 u[IU]/mL — ABNORMAL HIGH (ref 0.350–4.500)

## 2014-01-24 NOTE — Progress Notes (Signed)
Patient ID: Sandra Yates, female   DOB: Sep 23, 1960, 53 y.o.   MRN: [id number]  Called patient to discuss results of TSH. Plan for recheck in 6 months.  Algis Greenhouse. Jerline Pain, [place] Resident PGY-1 01/24/2014 5:45 PM

## 2014-04-16 ENCOUNTER — Other Ambulatory Visit: Payer: Self-pay | Admitting: Family Medicine

## 2014-04-16 DIAGNOSIS — E039 Hypothyroidism, unspecified: Secondary | ICD-10-CM

## 2014-04-16 MED ORDER — LEVOTHYROXINE SODIUM 25 MCG PO TABS: 25.0000 ug | ORAL_TABLET | Freq: Every day | ORAL | Status: DC

## 2014-04-16 NOTE — Telephone Encounter (Signed)
Pt called and needs a refill on her thyroid medication called in. She is out. Sandra Yates

## 2014-04-16 NOTE — Telephone Encounter (Signed)
refilled 

## 2014-07-23 ENCOUNTER — Ambulatory Visit (INDEPENDENT_AMBULATORY_CARE_PROVIDER_SITE_OTHER): Payer: 59 | Admitting: Obstetrics and Gynecology

## 2014-07-23 ENCOUNTER — Encounter: Payer: Self-pay | Admitting: Obstetrics and Gynecology

## 2014-07-23 VITALS — BP 124/84 | HR 80 | Temp 98.5°F | Ht 66.0 in | Wt 142.0 lb

## 2014-07-23 DIAGNOSIS — Z0000 Encounter for general adult medical examination without abnormal findings: Secondary | ICD-10-CM

## 2014-07-23 DIAGNOSIS — N951 Menopausal and female climacteric states: Secondary | ICD-10-CM

## 2014-07-23 DIAGNOSIS — R232 Flushing: Secondary | ICD-10-CM

## 2014-07-23 DIAGNOSIS — D259 Leiomyoma of uterus, unspecified: Secondary | ICD-10-CM

## 2014-07-23 DIAGNOSIS — E039 Hypothyroidism, unspecified: Secondary | ICD-10-CM

## 2014-07-23 DIAGNOSIS — M7542 Impingement syndrome of left shoulder: Secondary | ICD-10-CM

## 2014-07-23 LAB — TSH: TSH: 3.458 u[IU]/mL (ref 0.350–4.500)

## 2014-07-23 NOTE — Patient Instructions (Signed)
Sandra Yates it was great to see you today!  I am pleased to hear that things are going well for you.  Here are some of the things we discussed today: -I will call you about your lab results.  -We are setting up the ultrasound of your abdomen if need be I will refer you to Gynecology to discuss hysterectomy. -Please see handout for shoulder pain  Please schedule a follow-up appointment as needed if symptoms are not improved.   Thanks for allowing me to be a part of your care! Dr. Gerarda Fraction    Impingement Syndrome, Rotator Cuff, Bursitis with Rehab Impingement syndrome is a condition that involves inflammation of the tendons of the rotator cuff and the subacromial bursa, that causes pain in the shoulder. The rotator cuff consists of four tendons and muscles that control much of the shoulder and upper arm function. The subacromial bursa is a fluid filled sac that helps reduce friction between the rotator cuff and one of the bones of the shoulder (acromion). Impingement syndrome is usually an overuse injury that causes swelling of the bursa (bursitis), swelling of the tendon (tendonitis), and/or a tear of the tendon (strain). Strains are classified into three categories. Grade 1 strains cause pain, but the tendon is not lengthened. Grade 2 strains include a lengthened ligament, due to the ligament being stretched or partially ruptured. With grade 2 strains there is still function, although the function may be decreased. Grade 3 strains include a complete tear of the tendon or muscle, and function is usually impaired. SYMPTOMS   Pain around the shoulder, often at the outer portion of the upper arm.  Pain that gets worse with shoulder function, especially when reaching overhead or lifting.  Sometimes, aching when not using the arm.  Pain that wakes you up at night.  Sometimes, tenderness, swelling, warmth, or redness over the affected area.  Loss of strength.  Limited motion of the shoulder,  especially reaching behind the back (to the back pocket or to unhook bra) or across your body.  Crackling sound (crepitation) when moving the arm.  Biceps tendon pain and inflammation (in the front of the shoulder). Worse when bending the elbow or lifting. CAUSES  Impingement syndrome is often an overuse injury, in which chronic (repetitive) motions cause the tendons or bursa to become inflamed. A strain occurs when a force is paced on the tendon or muscle that is greater than it can withstand. Common mechanisms of injury include: Stress from sudden increase in duration, frequency, or intensity of training.  Direct hit (trauma) to the shoulder.  Aging, erosion of the tendon with normal use.  Bony bump on shoulder (acromial spur). RISK INCREASES WITH:  Contact sports (football, wrestling, boxing).  Throwing sports (baseball, tennis, volleyball).  Weightlifting and bodybuilding.  Heavy labor.  Previous injury to the rotator cuff, including impingement.  Poor shoulder strength and flexibility.  Failure to warm up properly before activity.  Inadequate protective equipment.  Old age.  Bony bump on shoulder (acromial spur). PREVENTION   Warm up and stretch properly before activity.  Allow for adequate recovery between workouts.  Maintain physical fitness:  Strength, flexibility, and endurance.  Cardiovascular fitness.  Learn and use proper exercise technique. PROGNOSIS  If treated properly, impingement syndrome usually goes away within 6 weeks. Sometimes surgery is required.  RELATED COMPLICATIONS   Longer healing time if not properly treated, or if not given enough time to heal.  Recurring symptoms, that result in a chronic condition.  Shoulder stiffness, frozen shoulder, or loss of motion.  Rotator cuff tendon tear.  Recurring symptoms, especially if activity is resumed too soon, with overuse, with a direct blow, or when using poor technique. TREATMENT    Treatment first involves the use of ice and medicine, to reduce pain and inflammation. The use of strengthening and stretching exercises may help reduce pain with activity. These exercises may be performed at home or with a therapist. If non-surgical treatment is unsuccessful after more than 6 months, surgery may be advised. After surgery and rehabilitation, activity is usually possible in 3 months.  MEDICATION  If pain medicine is needed, nonsteroidal anti-inflammatory medicines (aspirin and ibuprofen), or other minor pain relievers (acetaminophen), are often advised.  Do not take pain medicine for 7 days before surgery.  Prescription pain relievers may be given, if your caregiver thinks they are needed. Use only as directed and only as much as you need.  Corticosteroid injections may be given by your caregiver. These injections should be reserved for the most serious cases, because they may only be given a certain number of times. HEAT AND COLD  Cold treatment (icing) should be applied for 10 to 15 minutes every 2 to 3 hours for inflammation and pain, and immediately after activity that aggravates your symptoms. Use ice packs or an ice massage.  Heat treatment may be used before performing stretching and strengthening activities prescribed by your caregiver, physical therapist, or athletic trainer. Use a heat pack or a warm water soak. SEEK MEDICAL CARE IF:   Symptoms get worse or do not improve in 4 to 6 weeks, despite treatment.  New, unexplained symptoms develop. (Drugs used in treatment may produce side effects.) EXERCISES  RANGE OF MOTION (ROM) AND STRETCHING EXERCISES - Impingement Syndrome (Rotator Cuff  Tendinitis, Bursitis) These exercises may help you when beginning to rehabilitate your injury. Your symptoms may go away with or without further involvement from your physician, physical therapist or athletic trainer. While completing these exercises, remember:   Restoring tissue  flexibility helps normal motion to return to the joints. This allows healthier, less painful movement and activity.  An effective stretch should be held for at least 30 seconds.  A stretch should never be painful. You should only feel a gentle lengthening or release in the stretched tissue. STRETCH - Flexion, Standing  Stand with good posture. With an underhand grip on your right / left hand, and an overhand grip on the opposite hand, grasp a broomstick or cane so that your hands are a little more than shoulder width apart.  Keeping your right / left elbow straight and shoulder muscles relaxed, push the stick with your opposite hand, to raise your right / left arm in front of your body and then overhead. Raise your arm until you feel a stretch in your right / left shoulder, but before you have increased shoulder pain.  Try to avoid shrugging your right / left shoulder as your arm rises, by keeping your shoulder blade tucked down and toward your mid-back spine. Hold for __________ seconds.  Slowly return to the starting position. Repeat __________ times. Complete this exercise __________ times per day. STRETCH - Abduction, Supine  Lie on your back. With an underhand grip on your right / left hand and an overhand grip on the opposite hand, grasp a broomstick or cane so that your hands are a little more than shoulder width apart.  Keeping your right / left elbow straight and your shoulder muscles relaxed,  push the stick with your opposite hand, to raise your right / left arm out to the side of your body and then overhead. Raise your arm until you feel a stretch in your right / left shoulder, but before you have increased shoulder pain.  Try to avoid shrugging your right / left shoulder as your arm rises, by keeping your shoulder blade tucked down and toward your mid-back spine. Hold for __________ seconds.  Slowly return to the starting position. Repeat __________ times. Complete this exercise  __________ times per day. ROM - Flexion, Active-Assisted  Lie on your back. You may bend your knees for comfort.  Grasp a broomstick or cane so your hands are about shoulder width apart. Your right / left hand should grip the end of the stick, so that your hand is positioned "thumbs-up," as if you were about to shake hands.  Using your healthy arm to lead, raise your right / left arm overhead, until you feel a gentle stretch in your shoulder. Hold for __________ seconds.  Use the stick to assist in returning your right / left arm to its starting position. Repeat __________ times. Complete this exercise __________ times per day.  ROM - Internal Rotation, Supine   Lie on your back on a firm surface. Place your right / left elbow about 60 degrees away from your side. Elevate your elbow with a folded towel, so that the elbow and shoulder are the same height.  Using a broomstick or cane and your strong arm, pull your right / left hand toward your body until you feel a gentle stretch, but no increase in your shoulder pain. Keep your shoulder and elbow in place throughout the exercise.  Hold for __________ seconds. Slowly return to the starting position. Repeat __________ times. Complete this exercise __________ times per day. STRETCH - Internal Rotation  Place your right / left hand behind your back, palm up.  Throw a towel or belt over your opposite shoulder. Grasp the towel with your right / left hand.  While keeping an upright posture, gently pull up on the towel, until you feel a stretch in the front of your right / left shoulder.  Avoid shrugging your right / left shoulder as your arm rises, by keeping your shoulder blade tucked down and toward your mid-back spine.  Hold for __________ seconds. Release the stretch, by lowering your healthy hand. Repeat __________ times. Complete this exercise __________ times per day. ROM - Internal Rotation   Using an underhand grip, grasp a stick  behind your back with both hands.  While standing upright with good posture, slide the stick up your back until you feel a mild stretch in the front of your shoulder.  Hold for __________ seconds. Slowly return to your starting position. Repeat __________ times. Complete this exercise __________ times per day.  STRETCH - Posterior Shoulder Capsule   Stand or sit with good posture. Grasp your right / left elbow and draw it across your chest, keeping it at the same height as your shoulder.  Pull your elbow, so your upper arm comes in closer to your chest. Pull until you feel a gentle stretch in the back of your shoulder.  Hold for __________ seconds. Repeat __________ times. Complete this exercise __________ times per day. STRENGTHENING EXERCISES - Impingement Syndrome (Rotator Cuff Tendinitis, Bursitis) These exercises may help you when beginning to rehabilitate your injury. They may resolve your symptoms with or without further involvement from your physician, physical therapist or  Product/process development scientist. While completing these exercises, remember:  Muscles can gain both the endurance and the strength needed for everyday activities through controlled exercises.  Complete these exercises as instructed by your physician, physical therapist or athletic trainer. Increase the resistance and repetitions only as guided.  You may experience muscle soreness or fatigue, but the pain or discomfort you are trying to eliminate should never worsen during these exercises. If this pain does get worse, stop and make sure you are following the directions exactly. If the pain is still present after adjustments, discontinue the exercise until you can discuss the trouble with your clinician.  During your recovery, avoid activity or exercises which involve actions that place your injured hand or elbow above your head or behind your back or head. These positions stress the tissues which you are trying to heal. STRENGTH -  Scapular Depression and Adduction   With good posture, sit on a firm chair. Support your arms in front of you, with pillows, arm rests, or on a table top. Have your elbows in line with the sides of your body.  Gently draw your shoulder blades down and toward your mid-back spine. Gradually increase the tension, without tensing the muscles along the top of your shoulders and the back of your neck.  Hold for __________ seconds. Slowly release the tension and relax your muscles completely before starting the next repetition.  After you have practiced this exercise, remove the arm support and complete the exercise in standing as well as sitting position. Repeat __________ times. Complete this exercise __________ times per day.  STRENGTH - Shoulder Abductors, Isometric  With good posture, stand or sit about 4-6 inches from a wall, with your right / left side facing the wall.  Bend your right / left elbow. Gently press your right / left elbow into the wall. Increase the pressure gradually, until you are pressing as hard as you can, without shrugging your shoulder or increasing any shoulder discomfort.  Hold for __________ seconds.  Release the tension slowly. Relax your shoulder muscles completely before you begin the next repetition. Repeat __________ times. Complete this exercise __________ times per day.  STRENGTH - External Rotators, Isometric  Keep your right / left elbow at your side and bend it 90 degrees.  Step into a door frame so that the outside of your right / left wrist can press against the door frame without your upper arm leaving your side.  Gently press your right / left wrist into the door frame, as if you were trying to swing the back of your hand away from your stomach. Gradually increase the tension, until you are pressing as hard as you can, without shrugging your shoulder or increasing any shoulder discomfort.  Hold for __________ seconds.  Release the tension slowly.  Relax your shoulder muscles completely before you begin the next repetition. Repeat __________ times. Complete this exercise __________ times per day.  STRENGTH - Supraspinatus   Stand or sit with good posture. Grasp a __________ weight, or an exercise band or tubing, so that your hand is "thumbs-up," like you are shaking hands.  Slowly lift your right / left arm in a "V" away from your thigh, diagonally into the space between your side and straight ahead. Lift your hand to shoulder height or as far as you can, without increasing any shoulder pain. At first, many people do not lift their hands above shoulder height.  Avoid shrugging your right / left shoulder as your arm rises,  by keeping your shoulder blade tucked down and toward your mid-back spine.  Hold for __________ seconds. Control the descent of your hand, as you slowly return to your starting position. Repeat __________ times. Complete this exercise __________ times per day.  STRENGTH - External Rotators  Secure a rubber exercise band or tubing to a fixed object (table, pole) so that it is at the same height as your right / left elbow when you are standing or sitting on a firm surface.  Stand or sit so that the secured exercise band is at your uninjured side.  Bend your right / left elbow 90 degrees. Place a folded towel or small pillow under your right / left arm, so that your elbow is a few inches away from your side.  Keeping the tension on the exercise band, pull it away from your body, as if pivoting on your elbow. Be sure to keep your body steady, so that the movement is coming only from your rotating shoulder.  Hold for __________ seconds. Release the tension in a controlled manner, as you return to the starting position. Repeat __________ times. Complete this exercise __________ times per day.  STRENGTH - Internal Rotators   Secure a rubber exercise band or tubing to a fixed object (table, pole) so that it is at the same  height as your right / left elbow when you are standing or sitting on a firm surface.  Stand or sit so that the secured exercise band is at your right / left side.  Bend your elbow 90 degrees. Place a folded towel or small pillow under your right / left arm so that your elbow is a few inches away from your side.  Keeping the tension on the exercise band, pull it across your body, toward your stomach. Be sure to keep your body steady, so that the movement is coming only from your rotating shoulder.  Hold for __________ seconds. Release the tension in a controlled manner, as you return to the starting position. Repeat __________ times. Complete this exercise __________ times per day.  STRENGTH - Scapular Protractors, Standing   Stand arms length away from a wall. Place your hands on the wall, keeping your elbows straight.  Begin by dropping your shoulder blades down and toward your mid-back spine.  To strengthen your protractors, keep your shoulder blades down, but slide them forward on your rib cage. It will feel as if you are lifting the back of your rib cage away from the wall. This is a subtle motion and can be challenging to complete. Ask your caregiver for further instruction, if you are not sure you are doing the exercise correctly.  Hold for __________ seconds. Slowly return to the starting position, resting the muscles completely before starting the next repetition. Repeat __________ times. Complete this exercise __________ times per day. STRENGTH - Scapular Protractors, Supine  Lie on your back on a firm surface. Extend your right / left arm straight into the air while holding a __________ weight in your hand.  Keeping your head and back in place, lift your shoulder off the floor.  Hold for __________ seconds. Slowly return to the starting position, and allow your muscles to relax completely before starting the next repetition. Repeat __________ times. Complete this exercise  __________ times per day. STRENGTH - Scapular Protractors, Quadruped  Get onto your hands and knees, with your shoulders directly over your hands (or as close as you can be, comfortably).  Keeping your elbows locked,  lift the back of your rib cage up into your shoulder blades, so your mid-back rounds out. Keep your neck muscles relaxed.  Hold this position for __________ seconds. Slowly return to the starting position and allow your muscles to relax completely before starting the next repetition. Repeat __________ times. Complete this exercise __________ times per day.  STRENGTH - Scapular Retractors  Secure a rubber exercise band or tubing to a fixed object (table, pole), so that it is at the height of your shoulders when you are either standing, or sitting on a firm armless chair.  With a palm down grip, grasp an end of the band in each hand. Straighten your elbows and lift your hands straight in front of you, at shoulder height. Step back, away from the secured end of the band, until it becomes tense.  Squeezing your shoulder blades together, draw your elbows back toward your sides, as you bend them. Keep your upper arms lifted away from your body throughout the exercise.  Hold for __________ seconds. Slowly ease the tension on the band, as you reverse the directions and return to the starting position. Repeat __________ times. Complete this exercise __________ times per day. STRENGTH - Shoulder Extensors   Secure a rubber exercise band or tubing to a fixed object (table, pole) so that it is at the height of your shoulders when you are either standing, or sitting on a firm armless chair.  With a thumbs-up grip, grasp an end of the band in each hand. Straighten your elbows and lift your hands straight in front of you, at shoulder height. Step back, away from the secured end of the band, until it becomes tense.  Squeezing your shoulder blades together, pull your hands down to the sides of  your thighs. Do not allow your hands to go behind you.  Hold for __________ seconds. Slowly ease the tension on the band, as you reverse the directions and return to the starting position. Repeat __________ times. Complete this exercise __________ times per day.  STRENGTH - Scapular Retractors and External Rotators   Secure a rubber exercise band or tubing to a fixed object (table, pole) so that it is at the height as your shoulders, when you are either standing, or sitting on a firm armless chair.  With a palm down grip, grasp an end of the band in each hand. Bend your elbows 90 degrees and lift your elbows to shoulder height, at your sides. Step back, away from the secured end of the band, until it becomes tense.  Squeezing your shoulder blades together, rotate your shoulders so that your upper arms and elbows remain stationary, but your fists travel upward to head height.  Hold for __________ seconds. Slowly ease the tension on the band, as you reverse the directions and return to the starting position. Repeat __________ times. Complete this exercise __________ times per day.  STRENGTH - Scapular Retractors and External Rotators, Rowing   Secure a rubber exercise band or tubing to a fixed object (table, pole) so that it is at the height of your shoulders, when you are either standing, or sitting on a firm armless chair.  With a palm down grip, grasp an end of the band in each hand. Straighten your elbows and lift your hands straight in front of you, at shoulder height. Step back, away from the secured end of the band, until it becomes tense.  Step 1: Squeeze your shoulder blades together. Bending your elbows, draw your hands to  your chest, as if you are rowing a boat. At the end of this motion, your hands and elbow should be at shoulder height and your elbows should be out to your sides.  Step 2: Rotate your shoulders, to raise your hands above your head. Your forearms should be vertical and  your upper arms should be horizontal.  Hold for __________ seconds. Slowly ease the tension on the band, as you reverse the directions and return to the starting position. Repeat __________ times. Complete this exercise __________ times per day.  STRENGTH - Scapular Depressors  Find a sturdy chair without wheels, such as a dining room chair.  Keeping your feet on the floor, and your hands on the chair arms, lift your bottom up from the seat, and lock your elbows.  Keeping your elbows straight, allow gravity to pull your body weight down. Your shoulders will rise toward your ears.  Raise your body against gravity by drawing your shoulder blades down your back, shortening the distance between your shoulders and ears. Although your feet should always maintain contact with the floor, your feet should progressively support less body weight, as you get stronger.  Hold for __________ seconds. In a controlled and slow manner, lower your body weight to begin the next repetition. Repeat __________ times. Complete this exercise __________ times per day.  Document Released: 05/09/2005 Document Revised: 08/01/2011 Document Reviewed: 08/21/2008 [facility] Patient Information 2015 [place], [place]. This information is not intended to replace advice given to you by your health care provider. Make sure you discuss any questions you have with your health care provider.

## 2014-07-23 NOTE — Progress Notes (Signed)
    Subjective: Chief Complaint  Patient presents with  . Annual Exam    HPI: Sandra Yates is a 54 y.o. presenting to clinic today to discuss the following:  Mass in Stomach: -firm, hard, and located in LLQ -has noticed it growing over the years -worse with menstruation -not painful or tender -endorses sexual dificulty - hx of fibroids  L. Shoulder Pain: -painful can barely move - unable to put arm fully over head without pain -Happened before on r side but now better -Moved to left arm  -takes ibuprofen doesn't work -does not wake her up at night -feels like a nagging pain -thinks it may be due stress -denies any trauma to area  Hypothyroidism: - having hot flashes that she believes is due to thyroid dysfunction -recently switched to 45mcg about 6 months ago.  -states she never had TSH rechecked with last change -denies excessive fatigue, low energy, cold intolerance   Health Maintenance: Due for mammogram, colonoscopy, and HIV screening.    All systems were reviewed and were negative unless otherwise noted in the HPI Past Medical, Surgical, Social, and Family History Reviewed & Updated per EMR.   Objective: BP 124/84 mmHg  Pulse 80  Temp(Src) 98.5 F (36.9 C) (Oral)  Ht 5\' 6"  (1.676 m)  Wt 142 lb (64.411 kg)  BMI 22.93 kg/m2  LMP 06/23/2014  Physical Exam  Constitutional: She is oriented to person, place, and time and well-developed, well-nourished, and in no distress.  HENT:  Head: Normocephalic and atraumatic.  Mouth/Throat: Oropharynx is clear and moist.  Eyes: EOM are normal. Pupils are equal, round, and reactive to light.  Neck: Normal range of motion. Neck supple. No thyromegaly present.  Cardiovascular: Normal rate, regular rhythm, normal heart sounds and intact distal pulses.   Pulmonary/Chest: Effort normal and breath sounds normal.  Abdominal: Bowel sounds are normal. She exhibits mass. There is no hepatosplenomegaly. There is no tenderness.    Abnormal appearance of lower abdomen. Uterus measuring about 16wk in size. 2 large, firm palapable masses felt on uterus consistent with enlarged fibroids.   Genitourinary: Uterus is enlarged. Uterus is not tender.  Musculoskeletal:       Left shoulder: She exhibits decreased range of motion and pain. She exhibits no tenderness, no swelling, no crepitus, no deformity, normal pulse and normal strength.  Positive Neer's test. Negative Spurlings.  Lymphadenopathy:    She has no cervical adenopathy.  Neurological: She is alert and oriented to person, place, and time. Gait normal.  Skin: Skin is warm and dry.  Psychiatric: Mood and affect normal.    Assessment/Plan: Please see problem based Assessment and Plan   Orders Placed This Encounter  Procedures  . US Pelvis Complete    Standing Status: Future     Number of Occurrences:      Standing Expiration Date: 09/22/2015    Order Specific Question:  Reason for Exam (SYMPTOM  OR DIAGNOSIS REQUIRED)    Answer:  firbroid    Order Specific Question:  Preferred imaging location?    Answer:  [facility]  . TSH  . HIV antibody (with reflex)    Luiz Blare, DO 07/24/2014, 11:05 AM PGY-1, [place]

## 2014-07-24 ENCOUNTER — Encounter: Payer: Self-pay | Admitting: *Deleted

## 2014-07-24 ENCOUNTER — Telehealth: Payer: Self-pay | Admitting: *Deleted

## 2014-07-24 DIAGNOSIS — M7500 Adhesive capsulitis of unspecified shoulder: Secondary | ICD-10-CM | POA: Insufficient documentation

## 2014-07-24 DIAGNOSIS — R232 Flushing: Secondary | ICD-10-CM | POA: Insufficient documentation

## 2014-07-24 LAB — HIV ANTIBODY (ROUTINE TESTING W REFLEX): HIV 1&2 Ab, 4th Generation: NONREACTIVE

## 2014-07-24 NOTE — Telephone Encounter (Signed)
Letter mailed to patient. Akeelah Seppala,CMA  

## 2014-07-24 NOTE — Assessment & Plan Note (Signed)
Patient given handout on how to schedule screening colonoscopy and mammogram. Will obtain one time screening HIV test.

## 2014-07-24 NOTE — Telephone Encounter (Signed)
-----   Message from Katheren Shams, DO sent at 07/24/2014 10:50 AM EST ----- [facility] team, please call pt and let her know that her labs were all normal.  Thanks! Katheren Shams, DO

## 2014-07-24 NOTE — Assessment & Plan Note (Signed)
A: Doing well. Endorses hot flashes. Taking Synthroid 25mg  daily. P: Recheck TSH today. Last value was elevated. If normal, will continue to monitor without change. If abnormal will adjust synthroid as needed.

## 2014-07-24 NOTE — Assessment & Plan Note (Signed)
A: Hot flashes likely premenopausal symptoms. Transient episodes with spontaneous improvement. Not causing distress. P: Continue to monitor. Patient counseled. No need for treatment at this time.

## 2014-07-24 NOTE — Assessment & Plan Note (Signed)
A: L. shoulder impingement consistent with PE findings. No red flags. No joint instability.  P: No need for imaging at this time. Will continue to monitor. Handout given.Ppatient encouraged to continue NSAID use, PT exercises, and ice and rest. If not better within 2 weeks she will RTC to be reevaluated.

## 2014-07-24 NOTE — Assessment & Plan Note (Signed)
A: Enlarging fibroids worse with menstruation. Non-tender. Regular menses without menorrhagia. Uterus size about 16wk gravid. P: Ordered pelvic U/S to evaluate fibroids. If indicated will refer patient to gynecology to discuss surgical treatment options. Fibroids should shrink with menopause.

## 2014-07-29 ENCOUNTER — Other Ambulatory Visit: Payer: Self-pay | Admitting: Obstetrics and Gynecology

## 2014-07-29 DIAGNOSIS — D259 Leiomyoma of uterus, unspecified: Secondary | ICD-10-CM

## 2014-07-29 DIAGNOSIS — E039 Hypothyroidism, unspecified: Secondary | ICD-10-CM

## 2014-07-29 MED ORDER — LEVOTHYROXINE SODIUM 25 MCG PO TABS: 25.0000 ug | ORAL_TABLET | Freq: Every day | ORAL | Status: DC

## 2014-07-29 NOTE — Telephone Encounter (Signed)
Pt called and needs a refill on her Levothyroxine. jw ° °

## 2014-07-30 ENCOUNTER — Ambulatory Visit (HOSPITAL_COMMUNITY)
Admission: RE | Admit: 2014-07-30 | Discharge: 2014-07-30 | Disposition: A | Discharge status: Home / Self Care | Payer: 59 | Source: Ambulatory Visit | Attending: Family Medicine | Admitting: Family Medicine

## 2014-07-30 DIAGNOSIS — R1032 Left lower quadrant pain: Secondary | ICD-10-CM | POA: Diagnosis not present

## 2014-07-30 DIAGNOSIS — D259 Leiomyoma of uterus, unspecified: Secondary | ICD-10-CM | POA: Diagnosis present

## 2014-07-30 DIAGNOSIS — Z87410 Personal history of cervical dysplasia: Secondary | ICD-10-CM | POA: Diagnosis not present

## 2014-08-01 ENCOUNTER — Telehealth: Payer: Self-pay | Admitting: Obstetrics and Gynecology

## 2014-08-01 NOTE — Telephone Encounter (Signed)
Need result of tests from [facility]

## 2014-08-01 NOTE — Telephone Encounter (Signed)
Will forward to Md for ultrasound results. Jazmin Hartsell,CMA

## 2014-08-04 ENCOUNTER — Other Ambulatory Visit: Payer: Self-pay | Admitting: Obstetrics and Gynecology

## 2014-08-04 DIAGNOSIS — D259 Leiomyoma of uterus, unspecified: Secondary | ICD-10-CM

## 2014-08-04 NOTE — Telephone Encounter (Signed)
Called patient back and discussed her results. I think she is appropriate for a gynecology referral at this time. Order placed.

## 2014-08-07 ENCOUNTER — Encounter: Payer: Self-pay | Admitting: Obstetrics & Gynecology

## 2014-08-07 ENCOUNTER — Telehealth: Payer: Self-pay | Admitting: Obstetrics and Gynecology

## 2014-08-07 NOTE — Telephone Encounter (Signed)
Patient only needs Gyn appointment. No need for [facility] as patient has normal cervix. She is getting worked up for growing fibroids. Spoke with Pamala Hurry and issue will be clarified with patient.

## 2014-08-07 NOTE — Telephone Encounter (Signed)
Scheduled Sandra Yates with [place] [facility] on April 7th at 10:00.  If that's not what you wanted please skype me and let me know.  However, if that is okay please contact [name] to confirm appt.

## 2014-08-28 ENCOUNTER — Ambulatory Visit: Payer: 59

## 2014-09-11 ENCOUNTER — Encounter: Payer: Self-pay | Admitting: Obstetrics & Gynecology

## 2014-09-11 ENCOUNTER — Ambulatory Visit (INDEPENDENT_AMBULATORY_CARE_PROVIDER_SITE_OTHER): Payer: 59 | Admitting: Obstetrics & Gynecology

## 2014-09-11 VITALS — BP 134/77 | HR 87 | Temp 98.1°F | Wt 149.4 lb

## 2014-09-11 DIAGNOSIS — D259 Leiomyoma of uterus, unspecified: Secondary | ICD-10-CM | POA: Diagnosis not present

## 2014-09-11 NOTE — Patient Instructions (Signed)

## 2014-09-11 NOTE — Progress Notes (Signed)
Patient ID: Sandra Yates, female   DOB: 1960/05/31, 54 y.o.   MRN: [id number]  Chief Complaint  Patient presents with  . Fibroids    given her no problems    HPI Sandra Yates is a 54 y.o. female.  Menses are less frequent and not heavy, US showed fibroids, report reviewed  HPI  Past Medical History  Diagnosis Date  . Anemia   . Allergy   . Thyroid disease     No past surgical history on file.  Family History  Problem Relation Age of Onset  . Cancer Mother     lung primary/brain  . Diabetes Mother   . Asthma Brother   . Diabetes Brother   . Diabetes Brother   . Kidney disease Brother   . Cancer Father     ? lung    Social History History  Substance Use Topics  . Smoking status: Never Smoker   . Smokeless tobacco: Never Used  . Alcohol Use: No     Comment: 2/year     No Known Allergies  Current Outpatient Prescriptions  Medication Sig Dispense Refill  . Biotin 5000 MCG CAPS Take by mouth.    . Flaxseed, Linseed, 1000 MG CAPS Take 1 capsule by mouth 3 (three) times daily.    Marland Kitchen levothyroxine (SYNTHROID, LEVOTHROID) 25 MCG tablet Take 1 tablet (25 mcg total) by mouth daily. 60 tablet 2  . Multiple Vitamins-Minerals (WOMENS MULTI VITAMIN & MINERAL PO) Take 1 tablet by mouth 2 (two) times daily.    . ferrous sulfate 325 (65 FE) MG tablet Take 325 mg by mouth daily with breakfast.    . Ichthammol 20 % OINT Apply topically.     No current facility-administered medications for this visit.    Review of Systems Review of Systems  Constitutional: Negative.   Respiratory: Negative.   Genitourinary: Negative for vaginal discharge, menstrual problem and pelvic pain.    Blood pressure 134/77, pulse 87, temperature 98.1 F (36.7 C), temperature source Oral, weight 149 lb 6.4 oz (67.767 kg), last menstrual period 06/23/2014.  Physical Exam Physical Exam  Constitutional: She is oriented to person, place, and time. She appears well-developed. No distress.   Pulmonary/Chest: Effort normal. No respiratory distress.  Abdominal: She exhibits no mass.  Genitourinary: Vagina normal. No vaginal discharge found.  Uterus 10 week size no adnexal mass  Neurological: She is alert and oriented to person, place, and time.  Skin: Skin is warm and dry.  Psychiatric: She has a normal mood and affect. Her behavior is normal.    Data Reviewed CLINICAL DATA: Uterine leiomyoma. Mass and left lower quadrant for several years, now with left lower quadrant abdominal pain during menstruation. History of cervical dysplasia.  EXAM: TRANSABDOMINAL AND TRANSVAGINAL ULTRASOUND OF PELVIS  TECHNIQUE: Both transabdominal and transvaginal ultrasound examinations of the pelvis were performed. Transabdominal technique was performed for global imaging of the pelvis including uterus, ovaries, adnexal regions, and pelvic cul-de-sac. It was necessary to proceed with endovaginal exam following the transabdominal exam to visualize the bilateral ovaries.  COMPARISON: None  FINDINGS: Uterus  The uterus is enlarged measuring 14.0 x 8.1 x 13.0 cm and heterogeneous in appearance.  Multiple uterine fibroids are identified as follows:  Right uterine fundus: 6.5 x 7.6 x 6.6 cm  Left uterine fundus: 5.9 x 7.0 x 6.4 cm  Anterior midbody: 2.0 x 1.7 x 1.3 cm  Posterior midbody: 3.7 x 3.6 x 2.7 cm  Lower uterine segment: 8.6 x 8.2  x 7.9 cm  Endometrium  Suboptimally evaluated due to patient's large fibroids.  Right ovary  Only seen on transabdominal imaging. Normal in size measuring 2.2 x 2.1 x 1.5 cm. No discrete right-sided ovarian or adnexal lesion.  Left ovary  Not visualized.  Other findings  No free fluid.  IMPRESSION: 1. Enlarged myomatous uterus containing at least 5 fibroids, the largest of which within the lower uterine segment measuring 8.6 cm in diameter. 2. Suboptimal evaluation of the endometrial stripe secondary  to patient's large fibroids. 3. Nonvisualization of the left ovary.   Electronically Signed  By: Sandi Mariscal M.D.  On: 07/30/2014 16:05  Assessment    Fibroid uterus with no heavy bleeding or pain, perimenopause     Plan    Expectant management, RTC 6 month        Grazia Taffe 09/11/2014, 2:06 PM

## 2014-11-03 ENCOUNTER — Ambulatory Visit (INDEPENDENT_AMBULATORY_CARE_PROVIDER_SITE_OTHER): Payer: 59 | Admitting: Internal Medicine

## 2014-11-03 ENCOUNTER — Encounter: Payer: Self-pay | Admitting: Internal Medicine

## 2014-11-03 VITALS — BP 110/64 | HR 83 | Temp 97.6°F | Resp 16 | Ht 66.0 in | Wt 146.0 lb

## 2014-11-03 DIAGNOSIS — D259 Leiomyoma of uterus, unspecified: Secondary | ICD-10-CM | POA: Diagnosis not present

## 2014-11-03 DIAGNOSIS — E039 Hypothyroidism, unspecified: Secondary | ICD-10-CM

## 2014-11-03 DIAGNOSIS — M7502 Adhesive capsulitis of left shoulder: Secondary | ICD-10-CM | POA: Diagnosis not present

## 2014-11-03 MED ORDER — CYCLOBENZAPRINE HCL 5 MG PO TABS: 5.0000 mg | ORAL_TABLET | Freq: Three times a day (TID) | ORAL | Status: DC | PRN

## 2014-11-03 NOTE — Assessment & Plan Note (Signed)
Levels good at check in March and recheck at next visit. No symptoms of under or over replacement. Synthroid 25 mcg daily.

## 2014-11-03 NOTE — Assessment & Plan Note (Signed)
Seeing Ob/gyn and has several fibroids. Currently peri-menopausal and we discussed that since she is having no symptoms we should watch and wait as they may regress on their own with menopause.

## 2014-11-03 NOTE — Progress Notes (Signed)
Pre visit review using our clinic review tool, if applicable. No additional management support is needed unless otherwise documented below in the visit note. 

## 2014-11-03 NOTE — Progress Notes (Signed)
   Subjective:    Patient ID: Sandra Yates, female    DOB: Jul 21, 1960, 54 y.o.   MRN: [id number]  HPI The patient is a new 54 YO female who is coming in for left shoulder pain and stiffness. She has been struggling with it since January and has been to urgent care. They started her on meloxicam which was not helpful. She is now having limitation of her range of motion and some decrease in the amount of time she can use her arm (cannot hold things long or raise heavy items). No numbness or tingling in the hands. Pain is in the shoulder joint and upper arm muscles. Denies injury or trauma to the area. Rates it about 6/10 when trying to lit.   PMH, [facility], social history reviewed and updated with patient.   Review of Systems  Constitutional: Negative for fever, appetite change and fatigue.  HENT: Negative.   Eyes: Negative.   Respiratory: Negative for cough, chest tightness, shortness of breath and wheezing.   Cardiovascular: Negative for chest pain, palpitations and leg swelling.  Gastrointestinal: Positive for abdominal distention. Negative for nausea, abdominal pain, diarrhea and constipation.       From fibroids  Musculoskeletal: Positive for myalgias and arthralgias.  Skin: Negative.   Neurological: Negative.   Psychiatric/Behavioral: Negative.       Objective:   Physical Exam  Constitutional: She is oriented to person, place, and time. She appears well-developed and well-nourished.  HENT:  Head: Normocephalic and atraumatic.  Eyes: EOM are normal.  Neck: Normal range of motion.  Cardiovascular: Normal rate and regular rhythm.   Pulmonary/Chest: Effort normal and breath sounds normal. No respiratory distress. She has no wheezes.  Abdominal: Soft. She exhibits distension. There is no tenderness. There is no rebound.  Umbilical hernia small easily reducible. Some mild distention  Musculoskeletal: She exhibits tenderness.  Pain in the shoulder and biceps muscle. Limited active and  passive ROM left shoulder.   Neurological: She is alert and oriented to person, place, and time. Coordination normal.  Skin: Skin is warm and dry.  Psychiatric: She has a normal mood and affect.   Filed Vitals:   11/03/14 0803  BP: 110/64  Pulse: 83  Temp: 97.6 F (36.4 C)  TempSrc: Oral  Resp: 16  Height: 5\' 6"  (1.676 m)  Weight: 146 lb (66.225 kg)  SpO2: 92%      Assessment & Plan:

## 2014-11-03 NOTE — Assessment & Plan Note (Signed)
Going on for about 6 months now and worsening. Send to sports medicine for evaluation of corticosteroid injection. Likely will need physical therapy. Given ROM exercises to gradually stretch her shoulder. Has failed NSAID therapy and will try flexeril.

## 2014-11-03 NOTE — Patient Instructions (Signed)
We will send in a different medicine for the shoulder that is a muscle relaxer called flexeril. You can take it up to 3 times a day for pain and to help relax the muscles. We will also have you see the sports medicine doctor here to see if you need a shot in the shoulder versus physical therapy.   I have given you some shoulder exercises to work on the range of motion and you can try these. If you get pain with them then stop and be gentle with the stretching.   We will see you back in about 6 months so we can check on the thyroid and we can talk about the mammogram then.   Shoulder Range of Motion Exercises The shoulder is the most flexible joint in the human body. Because of this it is also the most unstable joint in the body. All ages can develop shoulder problems. Early treatment of problems is necessary for a good outcome. People react to shoulder pain by decreasing the movement of the joint. After a brief period of time, the shoulder can become "frozen". This is an almost complete loss of the ability to move the damaged shoulder. Following injuries your caregivers can give you instructions on exercises to keep your range of motion (ability to move your shoulder freely), or regain it if it has been lost.  [place]: Codman's Exercise or Pendulum Exercise  This exercise may be performed in a prone (face-down) lying position or standing while leaning on a chair with the opposite arm. Its purpose is to relax the muscles in your shoulder and slowly but surely increase the range of motion and to relieve pain.  Lie on your stomach close to the side edge of the bed. Let your weak arm hang over the edge of the bed. Relax your shoulder, arm and hand. Let your shoulder blade relax and drop down.  Slowly and gently swing your arm forward and back. Do not use your neck muscles; relax them. It might be easier to have someone else gently start swinging your  arm.  As pain decreases, increase your swing. To start, arm swing should begin at 15 degree angles. In time and as pain lessens, move to 30-45 degree angles. Start with swinging for about 15 seconds, and work towards swinging for 3 to 5 minutes.  This exercise may also be performed in a standing/bent over position.  Stand and hold onto a sturdy chair with your good arm. Bend forward at the waist and bend your knees slightly to help protect your back. Relax your weak arm, let it hang limp. Relax your shoulder blade and let it drop.  Keep your shoulder relaxed and use body motion to swing your arm in small circles.  Stand up tall and relax.  Repeat motion and change direction of circles.  Start with swinging for about 30 seconds, and work towards swinging for 3 to 5 minutes. STRETCHING EXERCISES:  Lift your arm out in front of you with the elbow bent at 90 degrees. Using your other arm gently pull the elbow forward and across your body.  Bend one arm behind you with the palm facing outward. Using the other arm, hold a towel or rope and reach this arm up above your head, then bend it at the elbow to move your wrist to behind your neck. Grab the free end of the towel with the hand behind your back. Gently pull the  towel up with the hand behind your neck, gradually increasing the pull on the hand behind the small of your back. Then, gradually pull down with the hand behind the small of your back. This will pull the hand and arm behind your neck further. Both shoulders will have an increased range of motion with repetition of this exercise. STRENGTHENING EXERCISES:  Standing with your arm at your side and straight out from your shoulder with the elbow bent at 90 degrees, hold onto a small weight and slowly raise your hand so it points straight up in the air. Repeat this five times to begin with, and gradually increase to ten times. Do this four times per day. As you grow stronger you can gradually  increase the weight.  Repeat the above exercise, only this time using an elastic band. Start with your hand up in the air and pull down until your hand is by your side. As you grow stronger, gradually increase the amount you pull by increasing the number or size of the elastic bands. Use the same amount of repetitions.  Standing with your hand at your side and holding onto a weight, gradually lift the hand in front of you until it is over your head. Do the same also with the hand remaining at your side and lift the hand away from your body until it is again over your head. Repeat this five times to begin with, and gradually increase to ten times. Do this four times per day. As you grow stronger you can gradually increase the weight. Document Released: 02/05/2003 Document Revised: 05/14/2013 Document Reviewed: 05/09/2005 [facility] Patient Information 2015 [place], [place]. This information is not intended to replace advice given to you by your health care provider. Make sure you discuss any questions you have with your health care provider.

## 2014-11-11 ENCOUNTER — Ambulatory Visit (INDEPENDENT_AMBULATORY_CARE_PROVIDER_SITE_OTHER)
Admission: RE | Admit: 2014-11-11 | Discharge: 2014-11-11 | Disposition: A | Discharge status: Home / Self Care | Payer: 59 | Source: Ambulatory Visit | Attending: Family Medicine | Admitting: Family Medicine

## 2014-11-11 ENCOUNTER — Encounter: Payer: Self-pay | Admitting: Family Medicine

## 2014-11-11 ENCOUNTER — Other Ambulatory Visit (INDEPENDENT_AMBULATORY_CARE_PROVIDER_SITE_OTHER): Payer: 59

## 2014-11-11 ENCOUNTER — Ambulatory Visit (INDEPENDENT_AMBULATORY_CARE_PROVIDER_SITE_OTHER): Payer: 59 | Admitting: Family Medicine

## 2014-11-11 VITALS — BP 114/72 | HR 96 | Ht 66.0 in | Wt 150.0 lb

## 2014-11-11 DIAGNOSIS — M25512 Pain in left shoulder: Secondary | ICD-10-CM

## 2014-11-11 DIAGNOSIS — M7502 Adhesive capsulitis of left shoulder: Secondary | ICD-10-CM | POA: Diagnosis not present

## 2014-11-11 MED ORDER — MELOXICAM 15 MG PO TABS
15.0000 mg | ORAL_TABLET | Freq: Every day | ORAL | Status: DC
Start: 2014-11-11 — End: 2015-04-03

## 2014-11-11 NOTE — Progress Notes (Signed)
[facility] [address], [place] [address] Phone: [phone] Subjective:    I'm seeing this patient by the request  of:  Olga Millers, MD   CC: Left shoulder pain  [id number] SALEM MASTROGIOVANNI is a 54 y.o. female coming in with complaint of left shoulder pain. Patient is had this pain for proximally 6 months. Patient does not remember any true injury. Started with pain in the and started with decreasing range of motion. Patient describes it as a dull throbbing aching pain that seems to be constant now that has a sharp pain with certain movements. Patient has lost the ability to be able to comb her hair or put on her bra. Patient rates the severity of pain is 8 out of 10. Did not respond to oral anti-inflammatory muscle relaxers. Patient states that it is starting to affect her daily activities as well. No weakness but she notices she uses her other arm for all activities.      Past Medical History  Diagnosis Date  . Anemia   . Allergy   . Thyroid disease    No past surgical history on file. History  Substance Use Topics  . Smoking status: Never Smoker   . Smokeless tobacco: Never Used  . Alcohol Use: No     Comment: 2/year    No Known Allergies Family History  Problem Relation Age of Onset  . Cancer Mother     lung primary/brain  . Diabetes Mother   . Asthma Brother   . Diabetes Brother   . Diabetes Brother   . Kidney disease Brother   . Cancer Father     ? lung     Past medical history, social, surgical and family history all reviewed in electronic medical record.   Review of Systems: No headache, visual changes, nausea, vomiting, diarrhea, constipation, dizziness, abdominal pain, skin rash, fevers, chills, night sweats, weight loss, swollen lymph nodes, body aches, joint swelling, muscle aches, chest pain, shortness of breath, mood changes.   Objective Blood pressure 114/72, pulse 96, height 5\' 6"  (1.676 m), weight  150 lb (68.04 kg), last menstrual period 09/10/2014, SpO2 99 %.  General: No apparent distress alert and oriented x3 mood and affect normal, dressed appropriately.  HEENT: Pupils equal, extraocular movements intact  Respiratory: Patient's speak in full sentences and does not appear short of breath  Cardiovascular: No lower extremity edema, non tender, no erythema  Skin: Warm dry intact with no signs of infection or rash on extremities or on axial skeleton.  Abdomen: Soft nontender  Neuro: Cranial nerves II through XII are intact, neurovascularly intact in all extremities with 2+ DTRs and 2+ pulses.  Lymph: No lymphadenopathy of posterior or anterior cervical chain or axillae bilaterally.  Gait normal with good balance and coordination.  MSK:  Non tender with full range of motion and good stability and symmetric strength and tone of shoulders, elbows, wrist, hip, knee and ankles bilaterally.  Shoulder: left Inspection reveals no abnormalities, atrophy or asymmetry. Palpation is normal with no tenderness over AC joint or bicipital groove. Decreased range of motion with forward flexion of 85 internal rotation to lateral hip and external rotation of 2 Rotator cuff strength or out of 5 compared to 5 out of 5 on the contralateral side signs of impingement with positive Neer and Hawkin's tests, but negative empty can sign. Speeds and Yergason's tests normal. No labral pathology noted with negative Obrien's, negative clunk and good  stability. Normal scapular function observed. No painful arc and no drop arm sign. No apprehension sign Contralateral shoulder unremarkable  MSK US performed of: left This study was ordered, performed, and interpreted by Charlann Boxer D.O.  Shoulder:   Supraspinatus:  Appears normal on long and transverse views, Bursal bulge seen with shoulder abduction on impingement view. Significant thickening of the anterior capsule Infraspinatus:  Appears normal on long and  transverse views. Significant increase in Doppler flow Subscapularis:  Appears normal on long and transverse views. Positive bursa significant thickening of the anterior capsule Teres Minor:  Appears normal on long and transverse views. AC joint:  Capsule undistended, no geyser sign. Glenohumeral Joint:  Appears normal without effusion. Glenoid Labrum:  Intact without visualized tears. Thickening of the posterior capsule Biceps Tendon:  Appears normal on long and transverse views, no fraying of tendon, tendon located in intertubercular groove, no subluxation with shoulder internal or external rotation.  Impression: Frozen shoulder  Procedure: Real-time Ultrasound Guided Injection of left glenohumeral joint Device: GE Logiq E  Ultrasound guided injection is preferred based studies that show increased duration, increased effect, greater accuracy, decreased procedural pain, increased response rate with ultrasound guided versus blind injection.  Verbal informed consent obtained.  Time-out conducted.  Noted no overlying erythema, induration, or other signs of local infection.  Skin prepped in a sterile fashion.  Local anesthesia: Topical Ethyl chloride.  With sterile technique and under real time ultrasound guidance:  Joint visualized.  23g 1  inch needle inserted posterior approach. Pictures taken for needle placement. Patient did have injection of 2 cc of 1% lidocaine, 2 cc of 0.5% Marcaine, and 1.0 cc of Kenalog 40 mg/dL. Completed without difficulty  Pain immediately resolved suggesting accurate placement of the medication.  Advised to call if fevers/chills, erythema, induration, drainage, or persistent bleeding.  Images permanently stored and available for review in the ultrasound unit.  Impression: Technically successful ultrasound guided injection.  [id number]; 15 minutes spent for Therapeutic exercises as stated in above notes.  This included exercises focusing on stretching, strengthening,  with significant focus on eccentric aspects.Shoulder Exercises that included:  Basic scapular stabilization to include adduction and depression of scapula Scaption, focusing on proper movement and good control Internal and External rotation utilizing a theraband, with elbow tucked at side entire time Rows with theraband   Proper technique shown and discussed handout in great detail with ATC.  All questions were discussed and answered.     Impression and Recommendations:     This case required medical decision making of moderate complexity.

## 2014-11-11 NOTE — Progress Notes (Signed)
Pre visit review using our clinic review tool, if applicable. No additional management support is needed unless otherwise documented below in the visit note. 

## 2014-11-11 NOTE — Assessment & Plan Note (Signed)
Patient given an injection today. We discussed oral as well as topical anti-inflammatory. Home exercise program given with athletic trainer. We discussed icing regimen. Patient come back again in 3 weeks and if continuing to have difficulty we would encourage patient to go to formal physical therapy and potentially a repeat injection. X-rays ordered as well.

## 2014-11-11 NOTE — Patient Instructions (Addendum)
Good to see you.  Ice 20 minutes 2 times daily. Usually after activity and before bed. Exercises 3 times a week.  pennsaid pinkie amount topically 2 times daily as needed.  Meloxicam daily for 10 days then as needed Keep it moving.  See me again in 3 weeks.

## 2014-12-02 ENCOUNTER — Encounter: Payer: Self-pay | Admitting: Family Medicine

## 2014-12-02 ENCOUNTER — Other Ambulatory Visit (INDEPENDENT_AMBULATORY_CARE_PROVIDER_SITE_OTHER): Payer: 59

## 2014-12-02 ENCOUNTER — Ambulatory Visit (INDEPENDENT_AMBULATORY_CARE_PROVIDER_SITE_OTHER): Payer: 59 | Admitting: Family Medicine

## 2014-12-02 VITALS — BP 116/82 | HR 104 | Ht 66.0 in | Wt 146.0 lb

## 2014-12-02 DIAGNOSIS — M7502 Adhesive capsulitis of left shoulder: Secondary | ICD-10-CM

## 2014-12-02 NOTE — Progress Notes (Signed)
Pre visit review using our clinic review tool, if applicable. No additional management support is needed unless otherwise documented below in the visit note. 

## 2014-12-02 NOTE — Progress Notes (Signed)
[facility] [address] [place], [place] [address] Phone: [phone] Subjective:     CC: Left shoulder pain follow up  IAX:[id number] Sandra Yates is a 54 y.o. female coming in with complaint of left shoulder pain. Patient was seen previously and had more of a frozen shoulder. Patient was given an injection as well as to do conservative therapy including home exercises, oral and time limit or's, icing protocol. Patient statesshe has minimal pain whatsoever and has had an improvement in range of motion of approximately 40-50%. Patient states that she is sleeping comfortably now. Not affecting his much daily activities. Patient states that she is happy with the results of far.      Past Medical History  Diagnosis Date  . Anemia   . Allergy   . Thyroid disease    No past surgical history on file. History  Substance Use Topics  . Smoking status: Never Smoker   . Smokeless tobacco: Never Used  . Alcohol Use: No     Comment: 2/year    No Known Allergies Family History  Problem Relation Age of Onset  . Cancer Mother     lung primary/brain  . Diabetes Mother   . Asthma Brother   . Diabetes Brother   . Diabetes Brother   . Kidney disease Brother   . Cancer Father     ? lung     Past medical history, social, surgical and family history all reviewed in electronic medical record.   Review of Systems: No headache, visual changes, nausea, vomiting, diarrhea, constipation, dizziness, abdominal pain, skin rash, fevers, chills, night sweats, weight loss, swollen lymph nodes, body aches, joint swelling, muscle aches, chest pain, shortness of breath, mood changes.   Objective Blood pressure 116/82, pulse 104, height 5\' 6"  (1.676 m), weight 146 lb (66.225 kg), last menstrual period 09/10/2014, SpO2 93 %.  General: No apparent distress alert and oriented x3 mood and affect normal, dressed appropriately.  HEENT: Pupils equal, extraocular movements intact    Respiratory: Patient's speak in full sentences and does not appear short of breath  Cardiovascular: No lower extremity edema, non tender, no erythema  Skin: Warm dry intact with no signs of infection or rash on extremities or on axial skeleton.  Abdomen: Soft nontender  Neuro: Cranial nerves II through XII are intact, neurovascularly intact in all extremities with 2+ DTRs and 2+ pulses.  Lymph: No lymphadenopathy of posterior or anterior cervical chain or axillae bilaterally.  Gait normal with good balance and coordination.  MSK:  Non tender with full range of motion and good stability and symmetric strength and tone of shoulders, elbows, wrist, hip, knee and ankles bilaterally.  Shoulder: left Inspection reveals no abnormalities, atrophy or asymmetry. Palpation is normal with no tenderness over AC joint or bicipital groove. Decreased range of motion with forward flexion of 145 internal rotation toL5 and external rotation of 5this is significantly improved range of motion from previous exam. Rotator cuff strength or out of 5 compared to 5 out of 5 on the contralateral side signs of impingement with positive Neer and Hawkin's tests, but negative empty can sign. Speeds and Yergason's tests normal. Normal scapular function observed. No painful arc and no drop arm sign. No apprehension sign Contralateral shoulder unremarkable   Procedure: Real-time Ultrasound Guided Injection of left glenohumeral joint Device: GE Logiq E  Ultrasound guided injection is preferred based studies that show increased duration, increased effect, greater accuracy, decreased procedural pain, increased  response rate with ultrasound guided versus blind injection.  Verbal informed consent obtained.  Time-out conducted.  Noted no overlying erythema, induration, or other signs of local infection.  Skin prepped in a sterile fashion.  Local anesthesia: Topical Ethyl chloride.  With sterile technique and under real time  ultrasound guidance:  Joint visualized.  23g 1  inch needle inserted posterior approach. Pictures taken for needle placement. Patient did have injection of  7cc 0.5% Marcaine, and 1.0 cc of Kenalog 40 mg/dL. Completed without difficulty  Pain immediately resolved suggesting accurate placement of the medication.  Advised to call if fevers/chills, erythema, induration, drainage, or persistent bleeding.  Images permanently stored and available for review in the ultrasound unit.  Impression: Technically successful ultrasound guided injection.       Impression and Recommendations:     This case required medical decision making of moderate complexity.

## 2014-12-02 NOTE — Assessment & Plan Note (Signed)
She was given another injection today with more Marcaine. Patient did have some of the adhesions pop today. We discussed icing regimen and home exercises. We discussed continuing the same treatment options were doing at this time. Patient will come back and see me again in 6 weeks for further evaluation.  Spent  25 minutes with patient face-to-face and had greater than 50% of counseling including as described above in assessment and plan.

## 2014-12-02 NOTE — Patient Instructions (Signed)
Good to see you Ice is your friend especially in 6 hours Continue the exercises Ibuprofen and tylenol can help Keep pushing it See me again in 6 weeks unless something changes

## 2015-01-21 ENCOUNTER — Telehealth: Payer: Self-pay | Admitting: *Deleted

## 2015-01-21 DIAGNOSIS — E039 Hypothyroidism, unspecified: Secondary | ICD-10-CM

## 2015-01-21 MED ORDER — LEVOTHYROXINE SODIUM 25 MCG PO TABS: 25.0000 ug | ORAL_TABLET | Freq: Every day | ORAL | Status: DC

## 2015-01-21 NOTE — Telephone Encounter (Signed)
Receive call pt is needing to get new rx on her levothyroxine. Was originally rx by previous md. Verified pharmacy inform will send to [place]...Johny Chess

## 2015-02-02 ENCOUNTER — Telehealth: Payer: Self-pay | Admitting: Internal Medicine

## 2015-02-03 ENCOUNTER — Encounter: Payer: Self-pay | Admitting: Internal Medicine

## 2015-02-03 ENCOUNTER — Ambulatory Visit (INDEPENDENT_AMBULATORY_CARE_PROVIDER_SITE_OTHER): Payer: 59 | Admitting: Internal Medicine

## 2015-02-03 VITALS — BP 124/84 | HR 92 | Temp 98.4°F | Resp 18 | Wt 148.0 lb

## 2015-02-03 DIAGNOSIS — B029 Zoster without complications: Secondary | ICD-10-CM

## 2015-02-03 MED ORDER — GABAPENTIN 100 MG PO CAPS: 100.0000 mg | ORAL_CAPSULE | Freq: Three times a day (TID) | ORAL | Status: DC

## 2015-02-03 MED ORDER — FAMCICLOVIR 500 MG PO TABS
500.0000 mg | ORAL_TABLET | Freq: Three times a day (TID) | ORAL | Status: DC
Start: 2015-02-03 — End: 2015-04-03

## 2015-02-03 NOTE — Progress Notes (Signed)
Pre visit review using our clinic review tool, if applicable. No additional management support is needed unless otherwise documented below in the visit note. 

## 2015-02-03 NOTE — Progress Notes (Signed)
   Subjective:    Patient ID: Sandra Yates, female    DOB: 1961-03-18, 54 y.o.   MRN: [id number]  HPI  Her symptoms began as a "boil" on her back on 9/10. This was in the context of a "cold". She does work in a daycare 3 days a week. She also had a temperature up to 101 on 9/10. She's had no definite signs of upper respiratory tract infection.  She was using topical over-the-counter with partial benefit. The lesions have progressed with involvement of the left anterior thorax as well. This has been associated with some burning    Review of Systems  No associated itchy, watery eyes.  Swelling of the lips or tongue denied.  Shortness of breath, wheezing  absent.  No  urticaria noted.  Purulence absent.  Diarrhea not present.      Objective:   Physical Exam Pertinent or positive findings include: Repeat pulse was 92. There is mild erythema of the nasal mucosa. She has a classic T-2 herpes zoster rash on the left thorax posteriorly and anteriorly above the breast.   General appearance :adequately nourished; in no distress.Appears younger than stated age  Eyes: No conjunctival inflammation or scleral icterus is present.  Oral exam:  Lips and gums are healthy appearing.There is no oropharyngeal erythema or exudate noted. Dental hygiene is good.  Heart:  Normal rate and regular rhythm. S1 and S2 normal without gallop, murmur, click, rub or other extra sounds    Lungs:Chest clear to auscultation; no wheezes, rhonchi,rales ,or rubs present.No increased work of breathing.   Abdomen: bowel sounds normal, soft and non-tender without masses, organomegaly or hernias noted.  No guarding or rebound.   Vascular : all pulses equal ; no bruits present.  Skin:Warm & dry.  Intact without suspicious lesions or rashes ; no tenting   Lymphatic: No lymphadenopathy is noted about the head, neck, axilla.   Neuro: Strength, tone  normal.       Assessment & Plan:   #1 T2 herpes zoster.See  orders. The pathophysiology of herpes zoster infection was discussed. Restrictions were also delineated. Medications prescribed and indications discussed.

## 2015-02-03 NOTE — Patient Instructions (Addendum)
After having herpes zoster or shingles;  a natural immunity against recurrent shingles should be present for approximately 24 months. After that time shingles immunization would be indicated.   Plain Mucinex (NOT D) for thick secretions ;force NON dairy fluids .   Nasal cleansing in the shower as discussed with lather of mild shampoo.After 10 seconds wash off lather while  exhaling through nostrils. Make sure that all residual soap is removed to prevent irritation.  Flonase OR Nasacort AQ 1 spray in each nostril twice a day as needed. Use the "crossover" technique into opposite nostril spraying toward opposite ear @ 45 degree angle, not straight up into nostril.  Plain Allegra (NOT D )  160 daily , Loratidine 10 mg , OR Zyrtec 10 mg @ bedtime  as needed for itchy eyes & sneezing.

## 2015-04-03 ENCOUNTER — Encounter: Payer: Self-pay | Admitting: Internal Medicine

## 2015-04-03 ENCOUNTER — Other Ambulatory Visit (INDEPENDENT_AMBULATORY_CARE_PROVIDER_SITE_OTHER): Payer: 59

## 2015-04-03 ENCOUNTER — Ambulatory Visit (INDEPENDENT_AMBULATORY_CARE_PROVIDER_SITE_OTHER): Payer: 59 | Admitting: Internal Medicine

## 2015-04-03 VITALS — BP 136/82 | HR 105 | Temp 98.5°F | Resp 18 | Ht 66.0 in | Wt 147.4 lb

## 2015-04-03 DIAGNOSIS — E039 Hypothyroidism, unspecified: Secondary | ICD-10-CM | POA: Diagnosis not present

## 2015-04-03 DIAGNOSIS — R233 Spontaneous ecchymoses: Secondary | ICD-10-CM

## 2015-04-03 DIAGNOSIS — R238 Other skin changes: Secondary | ICD-10-CM

## 2015-04-03 DIAGNOSIS — Z23 Encounter for immunization: Secondary | ICD-10-CM

## 2015-04-03 LAB — COMPREHENSIVE METABOLIC PANEL
ALBUMIN: 4.2 g/dL (ref 3.5–5.2)
ALT: 56 U/L — ABNORMAL HIGH (ref 0–35)
AST: 40 U/L — ABNORMAL HIGH (ref 0–37)
Alkaline Phosphatase: 61 U/L (ref 39–117)
BUN: 10 mg/dL (ref 6–23)
CALCIUM: 11.1 mg/dL — AB (ref 8.4–10.5)
CO2: 32 mEq/L (ref 19–32)
Chloride: 100 mEq/L (ref 96–112)
Creatinine, Ser: 0.82 mg/dL (ref 0.40–1.20)
GFR: 94.07 mL/min (ref >60.00)
Glucose, Bld: 94 mg/dL (ref 70–99)
Potassium: 4.8 mEq/L (ref 3.5–5.1)
Sodium: 140 mEq/L (ref 135–145)
Total Bilirubin: 0.6 mg/dL (ref 0.2–1.2)
Total Protein: 8.4 g/dL — ABNORMAL HIGH (ref 6.0–8.3)

## 2015-04-03 LAB — CBC
HCT: 35.4 % — ABNORMAL LOW (ref 36.0–46.0)
HEMOGLOBIN: 11.2 g/dL — AB (ref 12.0–15.0)
MCHC: 31.6 g/dL (ref 30.0–36.0)
MCV: 79.7 fl (ref 78.0–100.0)
PLATELETS: 239 10*3/uL (ref 150.0–400.0)
RBC: 4.44 Mil/uL (ref 3.87–5.11)
RDW: 15.5 % (ref 11.5–15.5)
WBC: 5.4 10*3/uL (ref 4.0–10.5)

## 2015-04-03 LAB — TSH: TSH: 2.39 u[IU]/mL (ref 0.35–4.50)

## 2015-04-03 LAB — T4, FREE: Free T4: 1.84 ng/dL — ABNORMAL HIGH (ref 0.60–1.60)

## 2015-04-03 NOTE — Progress Notes (Signed)
Pre visit review using our clinic review tool, if applicable. No additional management support is needed unless otherwise documented below in the visit note. 

## 2015-04-03 NOTE — Patient Instructions (Signed)
We will check the labs today and call you back about the results.   Call back [facility] to get the pap smear done and they can check for menopause. Their website has pictures so you can pick someone you feel comfortable with.

## 2015-04-03 NOTE — Progress Notes (Signed)
   Subjective:    Patient ID: Sandra Yates, female    DOB: 1960-12-31, 54 y.o.   MRN: [id number]  HPI The patient is a 54 YO female coming in for easy bruising. No changes to her medicines. Does not admit to injuries causing the bruises. No weight loss. No fevers or chills. No joint pain or swelling.   Review of Systems  Constitutional: Negative for fever, appetite change and fatigue.  HENT: Negative.   Eyes: Negative.   Respiratory: Negative for cough, chest tightness, shortness of breath and wheezing.   Cardiovascular: Negative for chest pain, palpitations and leg swelling.  Gastrointestinal: Negative for nausea, abdominal pain, diarrhea and constipation.  Skin: Negative.   Neurological: Negative.   Hematological: Bruises/bleeds easily.  Psychiatric/Behavioral: Negative.       Objective:   Physical Exam  Constitutional: She is oriented to person, place, and time. She appears well-developed and well-nourished.  HENT:  Head: Normocephalic and atraumatic.  Eyes: EOM are normal.  Neck: Normal range of motion.  Cardiovascular: Normal rate and regular rhythm.   Pulmonary/Chest: Effort normal and breath sounds normal. No respiratory distress. She has no wheezes.  Abdominal: Soft. There is no tenderness. There is no rebound.  Small bruise lower right abdomen.  Neurological: She is alert and oriented to person, place, and time. Coordination normal.  Skin: Skin is warm and dry.  Psychiatric: She has a normal mood and affect.   Filed Vitals:   04/03/15 1456  BP: 136/82  Pulse: 105  Temp: 98.5 F (36.9 C)  TempSrc: Oral  Resp: 18  Height: 5\' 6"  (1.676 m)  Weight: 147 lb 6.4 oz (66.86 kg)  SpO2: 98%      Assessment & Plan:

## 2015-04-04 DIAGNOSIS — R233 Spontaneous ecchymoses: Secondary | ICD-10-CM | POA: Insufficient documentation

## 2015-04-04 DIAGNOSIS — R238 Other skin changes: Secondary | ICD-10-CM | POA: Insufficient documentation

## 2015-04-04 NOTE — Assessment & Plan Note (Signed)
Checking CBC today and other labs. No family or personal history of blood dyscrasia. No joint swelling.

## 2015-04-09 ENCOUNTER — Telehealth: Payer: Self-pay | Admitting: Internal Medicine

## 2015-04-09 DIAGNOSIS — D509 Iron deficiency anemia, unspecified: Secondary | ICD-10-CM

## 2015-04-09 NOTE — Telephone Encounter (Signed)
Pt called asking to see if Dr. Sharlet Salina can give something stronger (not too expensive) for the chest congestion that she has. Inform pt's of lab result.

## 2015-04-09 NOTE — Telephone Encounter (Signed)
There is nothing that is prescription strength that is not available over the counter. With no history of high blood pressure she can use Sudafed or Coricidin. For her lab work, her kidney function and electrolytes are both within the normal limits. Her liver function tests are slightly elevated and I would recommend recheck in about a month. Her calcium was high therefore I would recommend changing her calcium supplement to every other day. And her white/red blood cells show a slight anemia, which may be iron related. I have placed blood work to check her iron levels to confirm at her convenience. There is no need for fasting.

## 2015-04-09 NOTE — Telephone Encounter (Signed)
Please advise, thanks.

## 2015-04-13 NOTE — Telephone Encounter (Signed)
Called patient and advised her of the note from you, she stated that she had been taking iron pills however was advised to stop due to taking too much. She was wanting to know if she should start taking her iron pills again since her blood work showed anemia or if she should continue to not take them like she was advised.

## 2015-04-13 NOTE — Telephone Encounter (Signed)
Please have her complete the iron blood work that was ordered which will tell us if she needs to be on the iron.

## 2015-05-05 ENCOUNTER — Other Ambulatory Visit (INDEPENDENT_AMBULATORY_CARE_PROVIDER_SITE_OTHER): Payer: 59

## 2015-05-05 ENCOUNTER — Encounter: Payer: Self-pay | Admitting: Internal Medicine

## 2015-05-05 ENCOUNTER — Ambulatory Visit (INDEPENDENT_AMBULATORY_CARE_PROVIDER_SITE_OTHER): Payer: 59 | Admitting: Internal Medicine

## 2015-05-05 VITALS — BP 110/78 | HR 98 | Temp 98.5°F | Resp 14 | Ht 66.0 in | Wt 150.0 lb

## 2015-05-05 DIAGNOSIS — D509 Iron deficiency anemia, unspecified: Secondary | ICD-10-CM | POA: Diagnosis not present

## 2015-05-05 DIAGNOSIS — Z0000 Encounter for general adult medical examination without abnormal findings: Secondary | ICD-10-CM

## 2015-05-05 DIAGNOSIS — E039 Hypothyroidism, unspecified: Secondary | ICD-10-CM

## 2015-05-05 LAB — COMPREHENSIVE METABOLIC PANEL
ALBUMIN: 4.2 g/dL (ref 3.5–5.2)
ALK PHOS: 57 U/L (ref 39–117)
ALT: 24 U/L (ref 0–35)
AST: 27 U/L (ref 0–37)
BUN: 12 mg/dL (ref 6–23)
CO2: 31 mEq/L (ref 19–32)
Calcium: 10.5 mg/dL (ref 8.4–10.5)
Chloride: 102 mEq/L (ref 96–112)
Creatinine, Ser: 0.82 mg/dL (ref 0.40–1.20)
GFR: 94.04 mL/min (ref >60.00)
Glucose, Bld: 91 mg/dL (ref 70–99)
Potassium: 4.3 mEq/L (ref 3.5–5.1)
SODIUM: 140 meq/L (ref 135–145)
TOTAL PROTEIN: 8.2 g/dL (ref 6.0–8.3)
Total Bilirubin: 0.5 mg/dL (ref 0.2–1.2)

## 2015-05-05 LAB — TSH: TSH: 23.26 u[IU]/mL — ABNORMAL HIGH (ref 0.35–4.50)

## 2015-05-05 LAB — T4, FREE: Free T4: 0.53 ng/dL — ABNORMAL LOW (ref 0.60–1.60)

## 2015-05-05 NOTE — Patient Instructions (Signed)
We will recheck the labs and call you back with the results.   We will also try to get you in with GI for the colonoscopy and that will probably be after the first of the year (you will meet with the doctor first)

## 2015-05-05 NOTE — Progress Notes (Signed)
   Subjective:    Patient ID: Sandra Yates, female    DOB: Jul 17, 1960, 54 y.o.   MRN: [id number]  HPI The patient is a 54 YO female coming in for follow up of her thyroid. She did stop taking her medicine as instructed about 1 month ago. She has not had any symptoms being off. No tremors, weight change, diarrhea, constipation. She is also interested in doing the colonoscopy since her blood counts were slightly low and she has never had one. No more easy bruising since last visit.   Review of Systems  Constitutional: Negative for fever, appetite change and fatigue.  HENT: Negative.   Eyes: Negative.   Respiratory: Negative for cough, chest tightness, shortness of breath and wheezing.   Cardiovascular: Negative for chest pain, palpitations and leg swelling.  Gastrointestinal: Positive for abdominal distention. Negative for nausea, abdominal pain, diarrhea and constipation.       From fibroids  Musculoskeletal: Positive for arthralgias.  Skin: Negative.   Neurological: Negative.   Psychiatric/Behavioral: Negative.       Objective:   Physical Exam  Constitutional: She is oriented to person, place, and time. She appears well-developed and well-nourished.  HENT:  Head: Normocephalic and atraumatic.  Eyes: EOM are normal.  Neck: Normal range of motion.  Cardiovascular: Normal rate and regular rhythm.   Pulmonary/Chest: Effort normal and breath sounds normal. No respiratory distress. She has no wheezes.  Abdominal: Soft. There is no tenderness. There is no rebound.  Neurological: She is alert and oriented to person, place, and time. Coordination normal.  Skin: Skin is warm and dry.  Psychiatric: She has a normal mood and affect.   Filed Vitals:   05/05/15 0941  BP: 110/78  Pulse: 98  Temp: 98.5 F (36.9 C)  TempSrc: Oral  Resp: 14  Height: 5\' 6"  (1.676 m)  Weight: 150 lb (68.04 kg)  SpO2: 98%      Assessment & Plan:

## 2015-05-05 NOTE — Assessment & Plan Note (Signed)
Likely due to her fibroids but since she is now in menopause and still with low levels have strongly recommended colonoscopy (has never had one). Referral to GI today.

## 2015-05-05 NOTE — Assessment & Plan Note (Signed)
She was hyperthyroid at last blood draw and she has stopped her medication. Checking levels today and likely keep her off her medicine. Repeat in 3 months for any signs of hypothyroidism developing.

## 2015-05-05 NOTE — Progress Notes (Signed)
Pre visit review using our clinic review tool, if applicable. No additional management support is needed unless otherwise documented below in the visit note. 

## 2015-05-06 ENCOUNTER — Encounter: Payer: Self-pay | Admitting: Gastroenterology

## 2015-05-06 LAB — HEPATITIS C ANTIBODY: HCV AB: NEGATIVE

## 2015-05-07 ENCOUNTER — Other Ambulatory Visit: Payer: Self-pay | Admitting: Internal Medicine

## 2015-05-08 ENCOUNTER — Telehealth: Payer: Self-pay | Admitting: Internal Medicine

## 2015-05-08 NOTE — Telephone Encounter (Signed)
Pt called you back and I informed her of her lab results.

## 2015-05-22 ENCOUNTER — Telehealth: Payer: Self-pay | Admitting: Internal Medicine

## 2015-05-22 NOTE — Telephone Encounter (Signed)
Pt requesting refill for levothyroxine (SYNTHROID, LEVOTHROID) 25 MCG tablet [id number]  Pharmacy is [facility] on [address]

## 2015-05-26 ENCOUNTER — Telehealth: Payer: Self-pay

## 2015-05-26 DIAGNOSIS — E039 Hypothyroidism, unspecified: Secondary | ICD-10-CM

## 2015-05-26 MED ORDER — LEVOTHYROXINE SODIUM 25 MCG PO TABS: 25.0000 ug | ORAL_TABLET | Freq: Every day | ORAL | Status: DC

## 2015-05-26 NOTE — Telephone Encounter (Signed)
A user error has taken place.

## 2015-06-24 ENCOUNTER — Ambulatory Visit (AMBULATORY_SURGERY_CENTER): Payer: Self-pay | Admitting: *Deleted

## 2015-06-24 VITALS — Ht 66.0 in | Wt 149.0 lb

## 2015-06-24 DIAGNOSIS — Z1211 Encounter for screening for malignant neoplasm of colon: Secondary | ICD-10-CM

## 2015-06-24 NOTE — Progress Notes (Signed)
Patient denies any allergies to eggs or soy. Patient denies any problems with anesthesia/sedation. Patient denies any oxygen use at home and does not take any diet/weight loss medications. EMMI education assisgned to patient on colonoscopy, this was explained and instructions given to patient. 

## 2015-07-08 ENCOUNTER — Encounter: Payer: Self-pay | Admitting: Gastroenterology

## 2015-07-08 ENCOUNTER — Ambulatory Visit (AMBULATORY_SURGERY_CENTER): Payer: BLUE CROSS/BLUE SHIELD | Admitting: Gastroenterology

## 2015-07-08 VITALS — BP 108/78 | HR 74 | Temp 98.2°F | Resp 17 | Ht 66.0 in | Wt 150.0 lb

## 2015-07-08 DIAGNOSIS — D123 Benign neoplasm of transverse colon: Secondary | ICD-10-CM

## 2015-07-08 DIAGNOSIS — Z1211 Encounter for screening for malignant neoplasm of colon: Secondary | ICD-10-CM | POA: Diagnosis not present

## 2015-07-08 DIAGNOSIS — K635 Polyp of colon: Secondary | ICD-10-CM

## 2015-07-08 MED ORDER — SODIUM CHLORIDE 0.9 % IV SOLN: 500.0000 mL | INTRAVENOUS | Status: DC

## 2015-07-08 NOTE — Progress Notes (Signed)
To pacu vss patent aw report to rn 

## 2015-07-08 NOTE — Progress Notes (Signed)
Called to room to assist during endoscopic procedure.  Patient ID and intended procedure confirmed with present staff. Received instructions for my participation in the procedure from the performing physician.  

## 2015-07-08 NOTE — Patient Instructions (Signed)
Impressions/recommendations:  Polyp (handout given) Internal hemorrhoids (handout given)  Repeat colonoscopy pending pathology results.  YOU HAD AN ENDOSCOPIC PROCEDURE TODAY AT [facility]:   Refer to the procedure report that was given to you for any specific questions about what was found during the examination.  If the procedure report does not answer your questions, please call your gastroenterologist to clarify.  If you requested that your care partner not be given the details of your procedure findings, then the procedure report has been included in a sealed envelope for you to review at your convenience later.  YOU SHOULD EXPECT: Some feelings of bloating in the abdomen. Passage of more gas than usual.  Walking can help get rid of the air that was put into your GI tract during the procedure and reduce the bloating. If you had a lower endoscopy (such as a colonoscopy or flexible sigmoidoscopy) you may notice spotting of blood in your stool or on the toilet paper. If you underwent a bowel prep for your procedure, you may not have a normal bowel movement for a few days.  Please Note:  You might notice some irritation and congestion in your nose or some drainage.  This is from the oxygen used during your procedure.  There is no need for concern and it should clear up in a day or so.  SYMPTOMS TO REPORT IMMEDIATELY:   Following lower endoscopy (colonoscopy or flexible sigmoidoscopy):  Excessive amounts of blood in the stool  Significant tenderness or worsening of abdominal pains  Swelling of the abdomen that is new, acute  Fever of 100F or higher  For urgent or emergent issues, a gastroenterologist can be reached at any hour by calling [phone].   DIET: Your first meal following the procedure should be a small meal and then it is ok to progress to your normal diet. Heavy or fried foods are harder to digest and may make you feel nauseous or bloated.  Likewise, meals  heavy in dairy and vegetables can increase bloating.  Drink plenty of fluids but you should avoid alcoholic beverages for 24 hours.  ACTIVITY:  You should plan to take it easy for the rest of today and you should NOT DRIVE or use heavy machinery until tomorrow (because of the sedation medicines used during the test).    FOLLOW UP: Our staff will call the number listed on your records the next business day following your procedure to check on you and address any questions or concerns that you may have regarding the information given to you following your procedure. If we do not reach you, we will leave a message.  However, if you are feeling well and you are not experiencing any problems, there is no need to return our call.  We will assume that you have returned to your regular daily activities without incident.  If any biopsies were taken you will be contacted by phone or by letter within the next 1-3 weeks.  Please call us at [phone] if you have not heard about the biopsies in 3 weeks.    SIGNATURES/CONFIDENTIALITY: You and/or your care partner have signed paperwork which will be entered into your electronic medical record.  These signatures attest to the fact that that the information above on your After Visit Summary has been reviewed and is understood.  Full responsibility of the confidentiality of this discharge information lies with you and/or your care-partner.

## 2015-07-08 NOTE — Op Note (Signed)
[place]  [address] [place], [id number]   COLONOSCOPY PROCEDURE REPORT  PATIENT: Sandra Yates, Sandra Yates  MR#: [id number] BIRTHDATE: May 23, 1961 , 71  yrs. old GENDER: female ENDOSCOPIST: Yetta Flock, MD REFERRED BY: [name] MD PROCEDURE DATE:  07/08/2015 PROCEDURE:   Colonoscopy, screening and Colonoscopy with biopsy First Screening Colonoscopy - Avg.  risk and is 50 yrs.  old or older Yes.  Prior Negative Screening - Now for repeat screening. N/A  History of Adenoma - Now for follow-up colonoscopy & has been > or = to 3 yrs.  N/A  Polyps removed today? Yes ASA CLASS:   Class II INDICATIONS:Screening for colonic neoplasia and Colorectal Neoplasm Risk Assessment for this procedure is average risk. MEDICATIONS: Propofol 280 mg IV  DESCRIPTION OF PROCEDURE:   After the risks benefits and alternatives of the procedure were thoroughly explained, informed consent was obtained.  The digital rectal exam revealed no abnormalities of the rectum.   The LB [id number]-[id number]  endoscope was introduced through the anus and advanced to the cecum, which was identified by both the appendix and ileocecal valve. No adverse events experienced.   The quality of the prep was adequate  The instrument was then slowly withdrawn as the colon was fully examined. Estimated blood loss is zero unless otherwise noted in this procedure report.    COLON FINDINGS: During intubation the prep was only fair with liquid brown stool throughout.  Time was taken to lavage the entire colon, after which adequate views were obtained for screening purposes. There was a 42mm flat polyp in the transverse colon, removed with cold forceps.  The remainder of the colon was normal.  Retroflexed views revealed internal hemorrhoids. The time to cecum = 4.4 Withdrawal time = 16.5   The scope was withdrawn and the procedure completed. COMPLICATIONS: There were no immediate  complications.  ENDOSCOPIC IMPRESSION: Fair prep (Miralax) initially, however following lavage the views were adequate for screening purposes Small transverse polyp removed with cold forceps Small internal hemorrhoids  RECOMMENDATIONS: Await pathology results Resume medications Resume diet For next colonoscopy would not use Miralax given preparation noted on today's exam  eSigned:  Yetta Flock, MD 07/08/2015 10:08 AM   cc:  [name] MD, the patient

## 2015-07-09 ENCOUNTER — Telehealth: Payer: Self-pay

## 2015-07-09 NOTE — Telephone Encounter (Signed)
  Follow up Call-  Call back number 07/08/2015  Post procedure Call Back phone  # [phone]  Permission to leave phone message Yes     Patient questions:  Do you have a fever, pain , or abdominal swelling? No. Pain Score  0 *  Have you tolerated food without any problems? Yes.    Have you been able to return to your normal activities? Yes.    Do you have any questions about your discharge instructions: Diet   No. Medications  No. Follow up visit  No.  Do you have questions or concerns about your Care? No.  Actions: * If pain score is 4 or above: No action needed, pain <4.

## 2015-07-13 ENCOUNTER — Encounter: Payer: Self-pay | Admitting: Gastroenterology

## 2015-08-03 ENCOUNTER — Ambulatory Visit: Payer: 59 | Admitting: Internal Medicine

## 2015-12-22 ENCOUNTER — Other Ambulatory Visit: Payer: Self-pay | Admitting: Internal Medicine

## 2015-12-22 DIAGNOSIS — Z1231 Encounter for screening mammogram for malignant neoplasm of breast: Secondary | ICD-10-CM

## 2015-12-30 ENCOUNTER — Ambulatory Visit
Admission: RE | Admit: 2015-12-30 | Discharge: 2015-12-30 | Disposition: A | Discharge status: Home / Self Care | Payer: BLUE CROSS/BLUE SHIELD | Source: Ambulatory Visit | Attending: Internal Medicine | Admitting: Internal Medicine

## 2015-12-30 DIAGNOSIS — Z1231 Encounter for screening mammogram for malignant neoplasm of breast: Secondary | ICD-10-CM

## 2015-12-31 ENCOUNTER — Other Ambulatory Visit (INDEPENDENT_AMBULATORY_CARE_PROVIDER_SITE_OTHER): Payer: BLUE CROSS/BLUE SHIELD

## 2015-12-31 ENCOUNTER — Encounter: Payer: Self-pay | Admitting: Internal Medicine

## 2015-12-31 ENCOUNTER — Ambulatory Visit (INDEPENDENT_AMBULATORY_CARE_PROVIDER_SITE_OTHER): Payer: BLUE CROSS/BLUE SHIELD | Admitting: Internal Medicine

## 2015-12-31 VITALS — BP 112/74 | HR 85 | Temp 97.5°F | Resp 16 | Ht 66.0 in | Wt 157.0 lb

## 2015-12-31 DIAGNOSIS — D509 Iron deficiency anemia, unspecified: Secondary | ICD-10-CM | POA: Diagnosis not present

## 2015-12-31 DIAGNOSIS — Z0000 Encounter for general adult medical examination without abnormal findings: Secondary | ICD-10-CM

## 2015-12-31 DIAGNOSIS — R7989 Other specified abnormal findings of blood chemistry: Secondary | ICD-10-CM

## 2015-12-31 DIAGNOSIS — E039 Hypothyroidism, unspecified: Secondary | ICD-10-CM | POA: Diagnosis not present

## 2015-12-31 LAB — LDL CHOLESTEROL, DIRECT: Direct LDL: 176 mg/dL

## 2015-12-31 LAB — COMPREHENSIVE METABOLIC PANEL
ALK PHOS: 61 U/L (ref 39–117)
ALT: 29 U/L (ref 0–35)
AST: 22 U/L (ref 0–37)
Albumin: 4.3 g/dL (ref 3.5–5.2)
BUN: 10 mg/dL (ref 6–23)
CO2: 30 meq/L (ref 19–32)
Calcium: 10.4 mg/dL (ref 8.4–10.5)
Chloride: 102 mEq/L (ref 96–112)
Creatinine, Ser: 0.84 mg/dL (ref 0.40–1.20)
GFR: 91.23 mL/min (ref >60.00)
GLUCOSE: 89 mg/dL (ref 70–99)
POTASSIUM: 4.4 meq/L (ref 3.5–5.1)
Sodium: 138 mEq/L (ref 135–145)
Total Bilirubin: 0.6 mg/dL (ref 0.2–1.2)
Total Protein: 8.5 g/dL — ABNORMAL HIGH (ref 6.0–8.3)

## 2015-12-31 LAB — LIPID PANEL
Cholesterol: 343 mg/dL — ABNORMAL HIGH (ref 0–200)
HDL: 41.8 mg/dL (ref >39.00)
NonHDL: 300.84
Total CHOL/HDL Ratio: 8
Triglycerides: 338 mg/dL — ABNORMAL HIGH (ref 0.0–149.0)
VLDL: 67.6 mg/dL — AB (ref 0.0–40.0)

## 2015-12-31 LAB — TSH: TSH: 27.54 u[IU]/mL — AB (ref 0.35–4.50)

## 2015-12-31 LAB — T4, FREE: FREE T4: 0.69 ng/dL (ref 0.60–1.60)

## 2015-12-31 NOTE — Assessment & Plan Note (Signed)
Reminded that she needs to get pap smear. Recent mammogram without interpretation yet. Colonoscopy up to date. Reminded about yearly flu shot. Checking labs. Counseled about the dangers of distracted driving. Given screening recommendations.

## 2015-12-31 NOTE — Assessment & Plan Note (Signed)
Due to fibroids and only 2 periods yet this year. Stable at last CBC and not checking today.

## 2015-12-31 NOTE — Patient Instructions (Signed)
Dr. Garwin Brothers is a good gynecologist and she is at [facility]. Dr. Lisbeth Renshaw is also good and is at [facility].   We will check the labs today and call you back with the results.   Try zyrtec (generic name is cetirizine) for the drainage to help with the breathing.   Melatonin is something to try for the sleeping (give it a week to help as it takes that long to get in your system).  Health Maintenance, Female Adopting a healthy lifestyle and getting preventive care can go a long way to promote health and wellness. Talk with your health care provider about what schedule of regular examinations is right for you. This is a good chance for you to check in with your provider about disease prevention and staying healthy. In between checkups, there are plenty of things you can do on your own. Experts have done a lot of research about which lifestyle changes and preventive measures are most likely to keep you healthy. Ask your health care provider for more information. WEIGHT AND DIET  Eat a healthy diet  Be sure to include plenty of vegetables, fruits, low-fat dairy products, and lean protein.  Do not eat a lot of foods high in solid fats, added sugars, or salt.  Get regular exercise. This is one of the most important things you can do for your health.  Most adults should exercise for at least 150 minutes each week. The exercise should increase your heart rate and make you sweat (moderate-intensity exercise).  Most adults should also do strengthening exercises at least twice a week. This is in addition to the moderate-intensity exercise.  Maintain a healthy weight  Body mass index (BMI) is a measurement that can be used to identify possible weight problems. It estimates body fat based on height and weight. Your health care provider can help determine your BMI and help you achieve or maintain a healthy weight.  For females 84 years of age and older:   A BMI below 18.5 is considered  underweight.  A BMI of 18.5 to 24.9 is normal.  A BMI of 25 to 29.9 is considered overweight.  A BMI of 30 and above is considered obese.  Watch levels of cholesterol and blood lipids  You should start having your blood tested for lipids and cholesterol at 53 years of age, then have this test every 5 years.  You may need to have your cholesterol levels checked more often if:  Your lipid or cholesterol levels are high.  You are older than 53 years of age.  You are at high risk for heart disease.  CANCER SCREENING   Lung Cancer  Lung cancer screening is recommended for adults 17-64 years old who are at high risk for lung cancer because of a history of smoking.  A yearly low-dose CT scan of the lungs is recommended for people who:  Currently smoke.  Have quit within the past 15 years.  Have at least a 30-pack-year history of smoking. A pack year is smoking an average of one pack of cigarettes a day for 1 year.  Yearly screening should continue until it has been 15 years since you quit.  Yearly screening should stop if you develop a health problem that would prevent you from having lung cancer treatment.  Breast Cancer  Practice breast self-awareness. This means understanding how your breasts normally appear and feel.  It also means doing regular breast self-exams. Let your health care provider know about any  changes, no matter how small.  If you are in your 20s or 30s, you should have a clinical breast exam (CBE) by a health care provider every 1-3 years as part of a regular health exam.  If you are 49 or older, have a CBE every year. Also consider having a breast X-ray (mammogram) every year.  If you have a family history of breast cancer, talk to your health care provider about genetic screening.  If you are at high risk for breast cancer, talk to your health care provider about having an MRI and a mammogram every year.  Breast cancer gene (BRCA) assessment is  recommended for women who have family members with BRCA-related cancers. BRCA-related cancers include:  Breast.  Ovarian.  Tubal.  Peritoneal cancers.  Results of the assessment will determine the need for genetic counseling and BRCA1 and BRCA2 testing. Cervical Cancer Your health care provider may recommend that you be screened regularly for cancer of the pelvic organs (ovaries, uterus, and vagina). This screening involves a pelvic examination, including checking for microscopic changes to the surface of your cervix (Pap test). You may be encouraged to have this screening done every 3 years, beginning at age 85.  For women ages 69-65, health care providers may recommend pelvic exams and Pap testing every 3 years, or they may recommend the Pap and pelvic exam, combined with testing for human papilloma virus (HPV), every 5 years. Some types of HPV increase your risk of cervical cancer. Testing for HPV may also be done on women of any age with unclear Pap test results.  Other health care providers may not recommend any screening for nonpregnant women who are considered low risk for pelvic cancer and who do not have symptoms. Ask your health care provider if a screening pelvic exam is right for you.  If you have had past treatment for cervical cancer or a condition that could lead to cancer, you need Pap tests and screening for cancer for at least 20 years after your treatment. If Pap tests have been discontinued, your risk factors (such as having a new sexual partner) need to be reassessed to determine if screening should resume. Some women have medical problems that increase the chance of getting cervical cancer. In these cases, your health care provider may recommend more frequent screening and Pap tests. Colorectal Cancer  This type of cancer can be detected and often prevented.  Routine colorectal cancer screening usually begins at 53 years of age and continues through 53 years of  age.  Your health care provider may recommend screening at an earlier age if you have risk factors for colon cancer.  Your health care provider may also recommend using home test kits to check for hidden blood in the stool.  A small camera at the end of a tube can be used to examine your colon directly (sigmoidoscopy or colonoscopy). This is done to check for the earliest forms of colorectal cancer.  Routine screening usually begins at age 66.  Direct examination of the colon should be repeated every 5-10 years through 53 years of age. However, you may need to be screened more often if early forms of precancerous polyps or small growths are found. Skin Cancer  Check your skin from head to toe regularly.  Tell your health care provider about any new moles or changes in moles, especially if there is a change in a mole's shape or color.  Also tell your health care provider if you have a  mole that is larger than the size of a pencil eraser.  Always use sunscreen. Apply sunscreen liberally and repeatedly throughout the day.  Protect yourself by wearing long sleeves, pants, a wide-brimmed hat, and sunglasses whenever you are outside. HEART DISEASE, DIABETES, AND HIGH BLOOD PRESSURE   High blood pressure causes heart disease and increases the risk of stroke. High blood pressure is more likely to develop in:  People who have blood pressure in the high end of the normal range (130-139/85-89 mm Hg).  People who are overweight or obese.  People who are African American.  If you are 106-36 years of age, have your blood pressure checked every 3-5 years. If you are 17 years of age or older, have your blood pressure checked every year. You should have your blood pressure measured twice--once when you are at a hospital or clinic, and once when you are not at a hospital or clinic. Record the average of the two measurements. To check your blood pressure when you are not at a hospital or clinic, you can  use:  An automated blood pressure machine at a pharmacy.  A home blood pressure monitor.  If you are between 79 years and 93 years old, ask your health care provider if you should take aspirin to prevent strokes.  Have regular diabetes screenings. This involves taking a blood sample to check your fasting blood sugar level.  If you are at a normal weight and have a low risk for diabetes, have this test once every three years after 53 years of age.  If you are overweight and have a high risk for diabetes, consider being tested at a younger age or more often. PREVENTING INFECTION  Hepatitis B  If you have a higher risk for hepatitis B, you should be screened for this virus. You are considered at high risk for hepatitis B if:  You were born in a country where hepatitis B is common. Ask your health care provider which countries are considered high risk.  Your parents were born in a high-risk country, and you have not been immunized against hepatitis B (hepatitis B vaccine).  You have HIV or AIDS.  You use needles to inject street drugs.  You live with someone who has hepatitis B.  You have had sex with someone who has hepatitis B.  You get hemodialysis treatment.  You take certain medicines for conditions, including cancer, organ transplantation, and autoimmune conditions. Hepatitis C  Blood testing is recommended for:  Everyone born from [birth year] through 1963.  Anyone with known risk factors for hepatitis C. Sexually transmitted infections (STIs)  You should be screened for sexually transmitted infections (STIs) including gonorrhea and chlamydia if:  You are sexually active and are younger than 53 years of age.  You are older than 53 years of age and your health care provider tells you that you are at risk for this type of infection.  Your sexual activity has changed since you were last screened and you are at an increased risk for chlamydia or gonorrhea. Ask your health care  provider if you are at risk.  If you do not have HIV, but are at risk, it may be recommended that you take a prescription medicine daily to prevent HIV infection. This is called pre-exposure prophylaxis (PrEP). You are considered at risk if:  You are sexually active and do not regularly use condoms or know the HIV status of your partner(s).  You take drugs by injection.  You are  sexually active with a partner who has HIV. Talk with your health care provider about whether you are at high risk of being infected with HIV. If you choose to begin PrEP, you should first be tested for HIV. You should then be tested every 3 months for as long as you are taking PrEP.  PREGNANCY   If you are premenopausal and you may become pregnant, ask your health care provider about preconception counseling.  If you may become pregnant, take 400 to 800 micrograms (mcg) of folic acid every day.  If you want to prevent pregnancy, talk to your health care provider about birth control (contraception). OSTEOPOROSIS AND MENOPAUSE   Osteoporosis is a disease in which the bones lose minerals and strength with aging. This can result in serious bone fractures. Your risk for osteoporosis can be identified using a bone density scan.  If you are 41 years of age or older, or if you are at risk for osteoporosis and fractures, ask your health care provider if you should be screened.  Ask your health care provider whether you should take a calcium or vitamin D supplement to lower your risk for osteoporosis.  Menopause may have certain physical symptoms and risks.  Hormone replacement therapy may reduce some of these symptoms and risks. Talk to your health care provider about whether hormone replacement therapy is right for you.  HOME CARE INSTRUCTIONS   Schedule regular health, dental, and eye exams.  Stay current with your immunizations.   Do not use any tobacco products including cigarettes, chewing tobacco, or  electronic cigarettes.  If you are pregnant, do not drink alcohol.  If you are breastfeeding, limit how much and how often you drink alcohol.  Limit alcohol intake to no more than 1 drink per day for nonpregnant women. One drink equals 12 ounces of beer, 5 ounces of wine, or 1 ounces of hard liquor.  Do not use street drugs.  Do not share needles.  Ask your health care provider for help if you need support or information about quitting drugs.  Tell your health care provider if you often feel depressed.  Tell your health care provider if you have ever been abused or do not feel safe at home.   This information is not intended to replace advice given to you by your health care provider. Make sure you discuss any questions you have with your health care provider.   Document Released: 11/22/2010 Document Revised: 05/30/2014 Document Reviewed: 04/10/2013 Elsevier Interactive Patient Education [employer].

## 2015-12-31 NOTE — Assessment & Plan Note (Signed)
Checking TSH and free T4, adjust synthroid 25 mcg daily as needed.  

## 2015-12-31 NOTE — Progress Notes (Signed)
   Subjective:    Patient ID: Sandra Yates, female    DOB: 08-Apr-1961, 55 y.o.   MRN: [id number]  HPI The patient is a 55 YO female coming in for wellness. No new concerns. Never returned for follow up of thyroid medication change last December.   PMH, [facility], social history reviewed and updated  Review of Systems  Constitutional: Negative for appetite change, fatigue and fever.  HENT: Negative.   Eyes: Negative.   Respiratory: Negative for cough, chest tightness, shortness of breath and wheezing.   Cardiovascular: Negative for chest pain, palpitations and leg swelling.  Gastrointestinal: Positive for abdominal distention. Negative for abdominal pain, constipation, diarrhea and nausea.       From fibroids  Musculoskeletal: Positive for arthralgias.  Skin: Negative.   Neurological: Negative.   Psychiatric/Behavioral: Negative.       Objective:   Physical Exam  Constitutional: She is oriented to person, place, and time. She appears well-developed and well-nourished.  HENT:  Head: Normocephalic and atraumatic.  Eyes: EOM are normal.  Neck: Normal range of motion. No thyromegaly present.  Cardiovascular: Normal rate and regular rhythm.   Pulmonary/Chest: Effort normal and breath sounds normal. No respiratory distress. She has no wheezes.  Abdominal: Soft. Bowel sounds are normal. There is no tenderness. There is no rebound.  Musculoskeletal: She exhibits no edema.  Lymphadenopathy:    She has no cervical adenopathy.  Neurological: She is alert and oriented to person, place, and time. Coordination normal.  Skin: Skin is warm and dry.  Psychiatric: She has a normal mood and affect.   Vitals:   12/31/15 0858  BP: 112/74  Pulse: 85  Resp: 16  Temp: 97.5 F (36.4 C)  TempSrc: Oral  SpO2: 98%  Weight: 157 lb (71.2 kg)  Height: 5\' 6"  (1.676 m)      Assessment & Plan:

## 2015-12-31 NOTE — Progress Notes (Signed)
Pre visit review using our clinic review tool, if applicable. No additional management support is needed unless otherwise documented below in the visit note. 

## 2016-03-02 ENCOUNTER — Other Ambulatory Visit: Payer: Self-pay | Admitting: *Deleted

## 2016-03-02 MED ORDER — LEVOTHYROXINE SODIUM 25 MCG PO TABS: 25.0000 ug | ORAL_TABLET | Freq: Every day | ORAL | 2 refills | Status: DC

## 2016-07-28 ENCOUNTER — Emergency Department (HOSPITAL_COMMUNITY): Payer: BLUE CROSS/BLUE SHIELD

## 2016-07-28 ENCOUNTER — Encounter (HOSPITAL_COMMUNITY): Payer: Self-pay

## 2016-07-28 ENCOUNTER — Emergency Department (HOSPITAL_COMMUNITY)
Admission: EM | Admit: 2016-07-28 | Discharge: 2016-07-28 | Disposition: A | Discharge status: Home / Self Care | Payer: BLUE CROSS/BLUE SHIELD | Attending: Emergency Medicine | Admitting: Emergency Medicine

## 2016-07-28 DIAGNOSIS — R1031 Right lower quadrant pain: Secondary | ICD-10-CM | POA: Diagnosis present

## 2016-07-28 DIAGNOSIS — D259 Leiomyoma of uterus, unspecified: Secondary | ICD-10-CM | POA: Diagnosis not present

## 2016-07-28 DIAGNOSIS — E039 Hypothyroidism, unspecified: Secondary | ICD-10-CM | POA: Diagnosis not present

## 2016-07-28 DIAGNOSIS — R1033 Periumbilical pain: Secondary | ICD-10-CM

## 2016-07-28 DIAGNOSIS — Z79899 Other long term (current) drug therapy: Secondary | ICD-10-CM | POA: Insufficient documentation

## 2016-07-28 DIAGNOSIS — K420 Umbilical hernia with obstruction, without gangrene: Secondary | ICD-10-CM | POA: Insufficient documentation

## 2016-07-28 HISTORY — DX: Benign neoplasm of connective and other soft tissue, unspecified: D21.9

## 2016-07-28 LAB — URINALYSIS, ROUTINE W REFLEX MICROSCOPIC
Bilirubin Urine: NEGATIVE
Glucose, UA: NEGATIVE mg/dL
Hgb urine dipstick: NEGATIVE
Ketones, ur: NEGATIVE mg/dL
Leukocytes, UA: NEGATIVE
Nitrite: NEGATIVE
Protein, ur: NEGATIVE mg/dL
Specific Gravity, Urine: 1.018 (ref 1.005–1.030)
pH: 8 (ref 5.0–8.0)

## 2016-07-28 LAB — COMPREHENSIVE METABOLIC PANEL
ALT: 27 U/L (ref 14–54)
AST: 22 U/L (ref 15–41)
Albumin: 4.2 g/dL (ref 3.5–5.0)
Alkaline Phosphatase: 59 U/L (ref 38–126)
Anion gap: 6 (ref 5–15)
BUN: 9 mg/dL (ref 6–20)
CO2: 25 mmol/L (ref 22–32)
Calcium: 10.3 mg/dL (ref 8.9–10.3)
Chloride: 107 mmol/L (ref 101–111)
Creatinine, Ser: 0.75 mg/dL (ref 0.44–1.00)
GFR calc Af Amer: >60 mL/min (ref >60)
GFR calc non Af Amer: >60 mL/min (ref >60)
Glucose, Bld: 91 mg/dL (ref 65–99)
Potassium: 4 mmol/L (ref 3.5–5.1)
Sodium: 138 mmol/L (ref 135–145)
Total Bilirubin: 0.9 mg/dL (ref 0.3–1.2)
Total Protein: 8.1 g/dL (ref 6.5–8.1)

## 2016-07-28 LAB — CBC
HCT: 34.5 % — ABNORMAL LOW (ref 36.0–46.0)
Hemoglobin: 11.1 g/dL — ABNORMAL LOW (ref 12.0–15.0)
MCH: 26.6 pg (ref 26.0–34.0)
MCHC: 32.2 g/dL (ref 30.0–36.0)
MCV: 82.5 fL (ref 78.0–100.0)
Platelets: 184 10*3/uL (ref 150–400)
RBC: 4.18 MIL/uL (ref 3.87–5.11)
RDW: 15.1 % (ref 11.5–15.5)
WBC: 4.2 10*3/uL (ref 4.0–10.5)

## 2016-07-28 LAB — LIPASE, BLOOD: Lipase: 25 U/L (ref 11–51)

## 2016-07-28 MED ORDER — IOPAMIDOL (ISOVUE-300) INJECTION 61%
INTRAVENOUS | Status: AC
  Administered 2016-07-28: 30 mL via ORAL
  Filled 2016-07-28: qty 30

## 2016-07-28 MED ORDER — HYDROMORPHONE HCL 1 MG/ML IJ SOLN
1.0000 mg | Freq: Once | INTRAMUSCULAR | Status: AC
  Administered 2016-07-28: 1 mg via INTRAVENOUS
  Filled 2016-07-28: qty 1

## 2016-07-28 MED ORDER — KETOROLAC TROMETHAMINE 15 MG/ML IJ SOLN
15.0000 mg | Freq: Once | INTRAMUSCULAR | Status: DC
  Filled 2016-07-28: qty 1

## 2016-07-28 MED ORDER — IOPAMIDOL (ISOVUE-300) INJECTION 61%
30.0000 mL | Freq: Once | INTRAVENOUS | Status: AC | PRN
  Administered 2016-07-28: 30 mL via ORAL

## 2016-07-28 MED ORDER — OXYCODONE-ACETAMINOPHEN 5-325 MG PO TABS: 1.0000 | ORAL_TABLET | ORAL | 0 refills | Status: DC | PRN

## 2016-07-28 MED ORDER — SODIUM CHLORIDE 0.9 % IV BOLUS (SEPSIS)
1000.0000 mL | Freq: Once | INTRAVENOUS | Status: AC
  Administered 2016-07-28: 1000 mL via INTRAVENOUS

## 2016-07-28 MED ORDER — ONDANSETRON HCL 4 MG/2ML IJ SOLN
4.0000 mg | Freq: Once | INTRAMUSCULAR | Status: AC
  Administered 2016-07-28: 4 mg via INTRAVENOUS
  Filled 2016-07-28: qty 2

## 2016-07-28 MED ORDER — HYDROMORPHONE HCL 1 MG/ML IJ SOLN
0.5000 mg | Freq: Once | INTRAMUSCULAR | Status: DC
  Filled 2016-07-28: qty 0.5

## 2016-07-28 MED ORDER — IOPAMIDOL (ISOVUE-300) INJECTION 61%
INTRAVENOUS | Status: AC
  Administered 2016-07-28: 100 mL
  Filled 2016-07-28: qty 100

## 2016-07-28 NOTE — Discharge Instructions (Signed)
Follow up with your gynecologist

## 2016-07-28 NOTE — ED Triage Notes (Signed)
Patient c/o right lower abdominal pain. Has abdominal distention,but says she has fibroids present.Patient denies N/V/D or fever. Patient went to [facility] and was told to come to the ED.

## 2016-07-28 NOTE — ED Notes (Signed)
Patient understood discharge instructions.  She is A & O x4. 

## 2016-07-28 NOTE — ED Provider Notes (Signed)
[facility] DEPT Provider Note   CSN: [id number] Arrival date & time: 07/28/16  1316     History   Chief Complaint Chief Complaint  Patient presents with  . Abdominal Pain    HPI Sandra Yates is a 56 y.o. female.  HPI   55yF right lower abdominal pain. Has abdominal distention,but says she has fibroids present.Patient denies N/V/D or fever. Patient went to [facility] and was told to come to the ED. Has had ongoing abdominal pain for years similar in character but in past couple days has been much more severe in intensity. Reports that just waist band of pants touching her belly button normally hurts.   Past Medical History:  Diagnosis Date  . Allergy   . Anemia   . Fibroids   . Thyroid disease     Patient Active Problem List   Diagnosis Date Noted  . Anemia, iron deficiency 05/05/2015  . Hot flashes 07/24/2014  . 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 Insomnia 01/17/2014  . Uterine fibroid 10/16/2013  . Recurrent boils 10/16/2013  . Health care maintenance 10/16/2013  . History of cervical dysplasia 03/07/2012  . Hypothyroidism 05/20/2009    Past Surgical History:  Procedure Laterality Date  . DENTAL SURGERY    . DILATION AND CURETTAGE OF UTERUS      OB History    No data available       Home Medications    Prior to Admission medications   Medication Sig Start Date End Date Taking? Authorizing Provider  Biotin 5000 MCG CAPS Take 1 capsule by mouth daily.    Yes Historical Provider, MD  Calcium Carbonate (CALCIUM 600 PO) Take 600 mg by mouth every other day.    Yes Historical Provider, MD  ibuprofen (ADVIL,MOTRIN) 200 MG tablet Take 400 mg by mouth every 6 (six) hours as needed for moderate pain.   Yes Historical Provider, MD  levothyroxine (SYNTHROID, LEVOTHROID) 25 MCG tablet Take 1 tablet (25 mcg total) by mouth daily. 03/02/16  Yes Hoyt Koch, MD  Multiple Vitamins-Minerals (WOMENS MULTI VITAMIN & MINERAL PO) Take 2 tablets by mouth daily.    Yes  Historical Provider, MD  Ichthammol 20 % OINT Apply 1 application topically daily as needed (itching). Reported on 07/08/2015    Historical Provider, MD    Family History Family History  Problem Relation Age of Onset  . Cancer Mother     lung primary/brain  . Diabetes Mother   . Cancer Father     ? lung  . Asthma Brother   . Diabetes Brother   . Diabetes Brother   . Kidney disease Brother   . Colon cancer Neg Hx     Social History Social History  Substance Use Topics  . Smoking status: Never Smoker  . Smokeless tobacco: Never Used  . Alcohol use No     Comment: 2/year      Allergies   Patient has no known allergies.   Review of Systems Review of Systems  All systems reviewed and negative, other than as noted in HPI.   Physical Exam Updated Vital Signs BP 133/83 (BP Location: Left Arm)   Pulse 80   Temp 98.2 F (36.8 C) (Oral)   Resp 20   Ht 5\' 6"  (1.676 m)   Wt 157 lb (71.2 kg)   LMP 07/14/2016 Comment: pt signed preg test waiver  SpO2 100%   BMI 25.34 kg/m   Physical Exam  Constitutional: She appears well-developed and well-nourished. No distress.  HENT:  Head: Normocephalic and atraumatic.  Eyes: Conjunctivae are normal. Right eye exhibits no discharge. Left eye exhibits no discharge.  Neck: Neck supple.  Cardiovascular: Normal rate, regular rhythm and normal heart sounds.  Exam reveals no gallop and no friction rub.   No murmur heard. Pulmonary/Chest: Effort normal and breath sounds normal. No respiratory distress.  Abdominal: Soft. She exhibits no distension. There is no tenderness.  Diffuse tenderness, worse periumbilically. umbilical hernia which reduces. No overlying skin changes.   Musculoskeletal: She exhibits no edema or tenderness.  Neurological: She is alert.  Skin: Skin is warm and dry.  Psychiatric: She has a normal mood and affect. Her behavior is normal. Thought content normal.  Nursing note and vitals reviewed.    ED Treatments /  Results  Labs (all labs ordered are listed, but only abnormal results are displayed) Labs Reviewed  CBC - Abnormal; Notable for the following:       Result Value   Hemoglobin 11.1 (*)    HCT 34.5 (*)    All other components within normal limits  URINALYSIS, ROUTINE W REFLEX MICROSCOPIC - Abnormal; Notable for the following:    APPearance HAZY (*)    All other components within normal limits  LIPASE, BLOOD  COMPREHENSIVE METABOLIC PANEL    EKG  EKG Interpretation None       Radiology Ct Abdomen Pelvis W Contrast  Result Date: 07/28/2016 CLINICAL DATA:  56 y/o  F; right-sided abdominal pain. EXAM: CT ABDOMEN AND PELVIS WITH CONTRAST TECHNIQUE: Multidetector CT imaging of the abdomen and pelvis was performed using the standard protocol following bolus administration of intravenous contrast. CONTRAST:  1 ISOVUE-300 IOPAMIDOL (ISOVUE-300) INJECTION 61% COMPARISON:  07/30/2014 pelvic ultrasound. FINDINGS: Lower chest: No acute abnormality. Hepatobiliary: No focal liver abnormality is seen. No intra or extrahepatic biliary ductal dilatation. Layering high attenuation within the dependent gallbladder probably represents sludge and small stones. Pancreas: Unremarkable. No pancreatic ductal dilatation or surrounding inflammatory changes. Spleen: Normal in size without focal abnormality. Adrenals/Urinary Tract: Adrenal glands are unremarkable. Kidneys are normal, without renal calculi, focal lesion, or hydronephrosis. Bladder is unremarkable. Stomach/Bowel: Stomach is within normal limits. Appendix appears normal. No evidence of bowel wall thickening, distention, or inflammatory changes. Vascular/Lymphatic: No significant vascular findings are present. No enlarged abdominal or pelvic lymph nodes. Reproductive: Massively enlarged myomatous uterus measuring 8.6 x 18.2 x 17.9 cm (AP x ML x CC series 3, image 46 and series 4, image 83) appear Other: Broad neck (77 x 39 mm ML by CC) paraumbilical hernia  containing fat and small bowel without obstructive changes. Musculoskeletal: No acute or significant osseous findings. IMPRESSION: 1. No acute process identified as explanation for pain. 2. Myomatous uterus is increased in size from the prior pelvic ultrasound now measuring up to 18.2 cm. 3. Broad neck paraumbilical hernia containing fat and small bowel without obstructive changes. Electronically Signed   By: Kristine Garbe M.D.   On: 07/28/2016 16:51    Procedures Procedures (including critical care time)  Medications Ordered in ED Medications  sodium chloride 0.9 % bolus 1,000 mL (1,000 mLs Intravenous New Bag/Given 07/28/16 1548)  HYDROmorphone (DILAUDID) injection 1 mg (1 mg Intravenous Given 07/28/16 1548)  ondansetron (ZOFRAN) injection 4 mg (4 mg Intravenous Given 07/28/16 1548)  iopamidol (ISOVUE-300) 61 % injection 30 mL (30 mLs Oral Contrast Given 07/28/16 1513)  iopamidol (ISOVUE-300) 61 % injection (100 mLs  Contrast Given 07/28/16 1633)     Initial Impression / Assessment and Plan / ED  Course  I have reviewed the triage vital signs and the nursing notes.  Pertinent labs & imaging results that were available during my care of the patient were reviewed by me and considered in my medical decision making (see chart for details).      Final Clinical Impressions(s) / ED Diagnoses   Final diagnoses:  Periumbilical abdominal pain  Umbilical hernia with obstruction, without gangrene  Uterine leiomyoma, unspecified location    New Prescriptions New Prescriptions   No medications on file     Virgel Manifold, MD 08/09/16 1345

## 2016-08-04 ENCOUNTER — Other Ambulatory Visit: Payer: Self-pay | Admitting: General Surgery

## 2016-09-13 ENCOUNTER — Other Ambulatory Visit: Payer: Self-pay | Admitting: General Surgery

## 2016-10-26 ENCOUNTER — Other Ambulatory Visit: Payer: Self-pay | Admitting: Obstetrics & Gynecology

## 2016-11-09 NOTE — Patient Instructions (Signed)
Your procedure is scheduled on:  Friday, November 18, 2016  Enter through the [employer] [facility] at:  10:00 AM  Pick up the phone at the desk and dial [phone].  Call this number if you have problems the morning of surgery: [phone].  Remember: Do NOT eat food or drink after:  Midnight Thursday  Take these medicines the morning of surgery with a SIP OF WATER:  None  Stop ALL herbal medications at this time  Do NOT smoke the day of surgery.  Do NOT wear jewelry (body piercing), metal hair clips/bobby pins, make-up, artifical eyelashes or nail polish. Do NOT wear lotions, powders, or perfumes.  You may wear deodorant. Do NOT shave for 48 hours prior to surgery. Do NOT bring valuables to the hospital. Contacts, dentures, or bridgework may not be worn into surgery.  Leave suitcase in car.  After surgery it may be brought to your room.  For patients admitted to the hospital, checkout time is 11:00 AM the day of discharge.  Bring a copy of your healthcare power of attorney and living will documents.

## 2016-11-10 ENCOUNTER — Encounter (HOSPITAL_COMMUNITY): Payer: Self-pay

## 2016-11-10 ENCOUNTER — Encounter (HOSPITAL_COMMUNITY)
Admission: RE | Admit: 2016-11-10 | Discharge: 2016-11-10 | Disposition: A | Discharge status: Home / Self Care | Payer: BLUE CROSS/BLUE SHIELD | Source: Ambulatory Visit | Attending: Obstetrics & Gynecology | Admitting: Obstetrics & Gynecology

## 2016-11-10 DIAGNOSIS — Z0183 Encounter for blood typing: Secondary | ICD-10-CM | POA: Diagnosis not present

## 2016-11-10 DIAGNOSIS — D259 Leiomyoma of uterus, unspecified: Secondary | ICD-10-CM | POA: Diagnosis not present

## 2016-11-10 DIAGNOSIS — Z01812 Encounter for preprocedural laboratory examination: Secondary | ICD-10-CM | POA: Diagnosis not present

## 2016-11-10 HISTORY — DX: Dizziness and giddiness: R42

## 2016-11-10 HISTORY — DX: Effusion, unspecified ankle: M25.473

## 2016-11-10 HISTORY — DX: Effusion, right ankle: M25.471

## 2016-11-10 HISTORY — DX: Effusion, left ankle: M25.472

## 2016-11-10 HISTORY — DX: Effusion, right foot: M25.474

## 2016-11-10 HISTORY — DX: Hypothyroidism, unspecified: E03.9

## 2016-11-10 HISTORY — DX: Effusion, left foot: M25.475

## 2016-11-10 HISTORY — DX: Insomnia, unspecified: G47.00

## 2016-11-10 HISTORY — DX: Cardiac arrhythmia, unspecified: I49.9

## 2016-11-10 HISTORY — DX: Effusion, unspecified foot: M25.476

## 2016-11-10 LAB — CBC
HCT: 35.7 % — ABNORMAL LOW (ref 36.0–46.0)
Hemoglobin: 10.6 g/dL — ABNORMAL LOW (ref 12.0–15.0)
MCH: 23.6 pg — AB (ref 26.0–34.0)
MCHC: 29.7 g/dL — AB (ref 30.0–36.0)
MCV: 79.3 fL (ref 78.0–100.0)
PLATELETS: 182 10*3/uL (ref 150–400)
RBC: 4.5 MIL/uL (ref 3.87–5.11)
RDW: 16.1 % — ABNORMAL HIGH (ref 11.5–15.5)
WBC: 4.3 10*3/uL (ref 4.0–10.5)

## 2016-11-10 LAB — TYPE AND SCREEN
ABO/RH(D): B POS
Antibody Screen: NEGATIVE

## 2016-11-10 LAB — ABO/RH: ABO/RH(D): B POS

## 2016-11-16 NOTE — H&P (Signed)
Sandra Yates Location: [facility] Patient #: [id number] DOB: [birth year] Married / Language: English / Race: Black or African American Female       History of Present Illness       The patient is a 56 year old female who presents for an evaluation of a hernia. This is a very pleasant 56 year old [place] American female. She is here with her husband throughout the counter. She is referred by Dr. Tyrell Yates in the ED for abdominal pain, umbilical hernia. Sandra Yates is her PCP.      The patient states she's had a hernia at her umbilicus for 10 years. She presented to the emergency room with sharp lower abdominal pain for 3 days but no change in her bowel or bladder habits. No nausea or vomiting. A CT scan was performed which shows enlarged fibroid uterus 18 cm in size. Umbilical hernia containing fat and small bowel but no obstructive changes. He was discharged home. She continues to have no GI or GU symptoms but she does have pain at her umbilicus and in the lower abdomen. She says she has initial period every 6 months. She does not have a gynecologist. It's been over 10 years. She's been advised to see a gynecologist before.      Comorbidities include uterine fibroids, dysfunctional uterine bleeding, hypothyroidism Family history reveals mother died of lung cancer. Father died of coronary artery disease Social history reveals that she is married. Her husband is with her today. One child. She is a housewife. Denies alcohol or tobacco. Husband works for [employer].     I told her that her hernia was small and reducible and probably not the cause of her pain. I told her uterus is tender wherever I touch it and it is enlarged to about 22 weeks size. I told her that the cause of her pain is her enlarged uterine fibroids. She was strongly advised to see a gynecologist in the near future for evaluation of dysfunctional uterine bleeding, uterine pain, and uterine  fibroids. I gave her some names but told her that her PCP should make a referral I told her that if the gynecologist wanted me to fix the hernia at the same time as a hysterectomy that I would do that. I advised her and her husband to call me and let me know what the plan is.   Allergies No Known Drug Allergies  Allergies Reconciled   Medication History  Biotin (5000MCG Capsule, Oral daily) Active. Calcium 600 + D (600-200MG -UNIT Tablet, Oral daily) Active. Ibuprofen 200 (200MG  Tablet, Oral as needed) Active. Ichthammol (20% Ointment, External as needed) Active. Synthroid (25MCG Tablet, Oral daily) Active. Multi-Minerals (Oral daily) Active. Oxycodone-Acetaminophen (5-325MG  Tablet, Oral as needed) Active. Medications Reconciled  Vitals  Weight: 160 lb Height: 66in Body Surface Area: 1.82 m Body Mass Index: 25.82 kg/m  Temp.: 98.8F  Pulse: 108 (Regular)  BP: 116/78 (Sitting, Left Arm, Standard)    Physical Exam  General Mental Status-Alert. General Appearance-Consistent with stated age. Hydration-Well hydrated. Voice-Normal. Note: Very pleasant and cooperative. Husband is with her. Moves slowly when she lies down and gets up from the exam table. Appears to be a healthy woman.   Head and Neck Head-normocephalic, atraumatic with no lesions or palpable masses. Trachea-midline. Thyroid Gland Characteristics - normal size and consistency.  Eye Eyeball - Bilateral-Extraocular movements intact. Sclera/Conjunctiva - Bilateral-No scleral icterus.  Chest and Lung Exam Chest and lung exam reveals -quiet, even and easy respiratory effort with no  use of accessory muscles and on auscultation, normal breath sounds, no adventitious sounds and normal vocal resonance. Inspection Chest Wall - Normal. Back - normal.  Cardiovascular Cardiovascular examination reveals -normal heart sounds, regular rate and rhythm with no murmurs and normal  pedal pulses bilaterally.  Abdomen Note: She has a small reducible umbilical hernia. Her uterus is enlarged and is actually palpable just above the superior rim of the umbilicus. The uterus is tender everywhere. I do not think that the hernia is incarcerated or inflamed and I think that the cause of her pain is her uterus. I do not feel any hernias otherwise. Liver and spleen not enlarged. No inguinal adenopathy or mass.   Neurologic Neurologic evaluation reveals -alert and oriented x 3 with no impairment of recent or remote memory. Mental Status-Normal.  Musculoskeletal Normal Exam - Left-Upper Extremity Strength Normal and Lower Extremity Strength Normal. Normal Exam - Right-Upper Extremity Strength Normal and Lower Extremity Strength Normal.  Lymphatic Head & Neck  General Head & Neck Lymphatics: Bilateral - Description - Normal. Axillary  General Axillary Region: Bilateral - Description - Normal. Tenderness - Non Tender. Femoral & Inguinal  Generalized Femoral & Inguinal Lymphatics: Bilateral - Description - Normal. Tenderness - Non Tender.    Assessment & Plan  UMBILICAL HERNIA WITHOUT OBSTRUCTION OR GANGRENE (K42.9)   You have a small, reducible umbilical hernia. I doubt that your pain is caused by the umbilical hernia  I can feel your enlarged uterus above the level of the umbilicus. Your CT scan suggest that you have multiple uterine fibroids, and I think that your tenderness is due to your uterine fibroids You have irregular menstrual periods every 6 months and that is abnormal      The next step in your care is referral to a gynecologist to thoroughly evaluate you for uterine pain, dysfunctional uterine bleeding, and whether or not you need a hysterectomy. If the gynecologist wants me to fix the umbilical hernia at the same time as the hysterectomy I will be happy to do that I gave you some names of gynecologist that I know. ask your primary care  physician, Dr. Sharlet Yates, to refer you to a gynecologist as soon as possible.      Please call me and let me know what your plan is  Pt Education - Sandra Yates - Hernia Surgery: discussed with patient and provided information.   UTERINE LEIOMYOMA, UNSPECIFIED LOCATION (D25.9) Impression: Multiple. Painful. Uterus enlarged to [redacted] week gestation size. HYPOTHYROIDISM, ADULT (E03.9)    Paytan Recine M. Dalbert Batman, M.D., [facility] General and Minimally invasive Surgery Breast and Colorectal Surgery Office:   [phone] Pager:   [phone]

## 2016-11-18 ENCOUNTER — Inpatient Hospital Stay (HOSPITAL_COMMUNITY): Payer: BLUE CROSS/BLUE SHIELD | Admitting: Anesthesiology

## 2016-11-18 ENCOUNTER — Encounter (HOSPITAL_COMMUNITY): Payer: Self-pay

## 2016-11-18 ENCOUNTER — Encounter (HOSPITAL_COMMUNITY)
Admission: RE | Disposition: A | Discharge status: Home / Self Care | Payer: Self-pay | Source: Ambulatory Visit | Attending: Obstetrics & Gynecology

## 2016-11-18 ENCOUNTER — Inpatient Hospital Stay (HOSPITAL_COMMUNITY)
Admission: RE | Admit: 2016-11-18 | Discharge: 2016-11-20 | DRG: 742 | Disposition: A | Discharge status: Home / Self Care | Payer: BLUE CROSS/BLUE SHIELD | Source: Ambulatory Visit | Attending: Obstetrics & Gynecology | Admitting: Obstetrics & Gynecology

## 2016-11-18 DIAGNOSIS — Z9079 Acquired absence of other genital organ(s): Secondary | ICD-10-CM

## 2016-11-18 DIAGNOSIS — Z90722 Acquired absence of ovaries, bilateral: Secondary | ICD-10-CM

## 2016-11-18 DIAGNOSIS — K420 Umbilical hernia with obstruction, without gangrene: Secondary | ICD-10-CM | POA: Diagnosis present

## 2016-11-18 DIAGNOSIS — E039 Hypothyroidism, unspecified: Secondary | ICD-10-CM | POA: Diagnosis present

## 2016-11-18 DIAGNOSIS — D259 Leiomyoma of uterus, unspecified: Secondary | ICD-10-CM | POA: Diagnosis present

## 2016-11-18 DIAGNOSIS — Z90710 Acquired absence of both cervix and uterus: Secondary | ICD-10-CM

## 2016-11-18 DIAGNOSIS — N938 Other specified abnormal uterine and vaginal bleeding: Secondary | ICD-10-CM | POA: Diagnosis present

## 2016-11-18 DIAGNOSIS — K429 Umbilical hernia without obstruction or gangrene: Secondary | ICD-10-CM

## 2016-11-18 HISTORY — DX: Umbilical hernia without obstruction or gangrene: K42.9

## 2016-11-18 HISTORY — PX: HYSTERECTOMY ABDOMINAL WITH SALPINGECTOMY: SHX6725

## 2016-11-18 HISTORY — PX: UMBILICAL HERNIA REPAIR: SHX196

## 2016-11-18 LAB — CBC
HCT: 32.1 % — ABNORMAL LOW (ref 36.0–46.0)
Hemoglobin: 9.6 g/dL — ABNORMAL LOW (ref 12.0–15.0)
MCH: 23.2 pg — ABNORMAL LOW (ref 26.0–34.0)
MCHC: 29.9 g/dL — ABNORMAL LOW (ref 30.0–36.0)
MCV: 77.5 fL — AB (ref 78.0–100.0)
Platelets: 175 10*3/uL (ref 150–400)
RBC: 4.14 MIL/uL (ref 3.87–5.11)
RDW: 16.5 % — ABNORMAL HIGH (ref 11.5–15.5)
WBC: 13.1 10*3/uL — AB (ref 4.0–10.5)

## 2016-11-18 SURGERY — HYSTERECTOMY, TOTAL, ABDOMINAL, WITH SALPINGECTOMY
Anesthesia: General

## 2016-11-18 MED ORDER — MENTHOL 3 MG MT LOZG: 1.0000 | LOZENGE | OROMUCOSAL | Status: DC | PRN

## 2016-11-18 MED ORDER — ROCURONIUM BROMIDE 10 MG/ML (PF) SYRINGE
PREFILLED_SYRINGE | INTRAVENOUS | Status: DC | PRN
  Administered 2016-11-18: 20 mg via INTRAVENOUS
  Administered 2016-11-18: 60 mg via INTRAVENOUS

## 2016-11-18 MED ORDER — ROCURONIUM BROMIDE 100 MG/10ML IV SOLN
INTRAVENOUS | Status: AC
  Filled 2016-11-18: qty 1

## 2016-11-18 MED ORDER — SUGAMMADEX SODIUM 200 MG/2ML IV SOLN
INTRAVENOUS | Status: DC | PRN
  Administered 2016-11-18: 200 mg via INTRAVENOUS

## 2016-11-18 MED ORDER — LIDOCAINE 2% (20 MG/ML) 5 ML SYRINGE
INTRAMUSCULAR | Status: DC | PRN
  Administered 2016-11-18: 80 mg via INTRAVENOUS

## 2016-11-18 MED ORDER — ONDANSETRON HCL 4 MG/2ML IJ SOLN: 4.0000 mg | Freq: Four times a day (QID) | INTRAMUSCULAR | Status: DC | PRN

## 2016-11-18 MED ORDER — MORPHINE SULFATE 2 MG/ML IV SOLN
INTRAVENOUS | Status: DC
  Administered 2016-11-18: 9 mg via INTRAVENOUS
  Administered 2016-11-18: 2 mg via INTRAVENOUS
  Administered 2016-11-19: 1.5 mg via INTRAVENOUS
  Administered 2016-11-19: 0 via INTRAVENOUS
  Administered 2016-11-19: 4.5 mg via INTRAVENOUS
  Filled 2016-11-18: qty 30

## 2016-11-18 MED ORDER — CHLORHEXIDINE GLUCONATE CLOTH 2 % EX PADS
6.0000 | MEDICATED_PAD | Freq: Once | CUTANEOUS | Status: AC
  Administered 2016-11-18: 6 via TOPICAL

## 2016-11-18 MED ORDER — PROPOFOL 10 MG/ML IV BOLUS
INTRAVENOUS | Status: DC | PRN
  Administered 2016-11-18: 180 mg via INTRAVENOUS

## 2016-11-18 MED ORDER — BUPIVACAINE-EPINEPHRINE (PF) 0.25% -1:200000 IJ SOLN
INTRAMUSCULAR | Status: AC
  Filled 2016-11-18: qty 30

## 2016-11-18 MED ORDER — ONDANSETRON HCL 4 MG PO TABS: 4.0000 mg | ORAL_TABLET | Freq: Four times a day (QID) | ORAL | Status: DC | PRN

## 2016-11-18 MED ORDER — HYDROMORPHONE HCL 1 MG/ML IJ SOLN
INTRAMUSCULAR | Status: AC
  Administered 2016-11-18: 0.5 mg via INTRAVENOUS
  Filled 2016-11-18: qty 0.5

## 2016-11-18 MED ORDER — DIPHENHYDRAMINE HCL 12.5 MG/5ML PO ELIX: 12.5000 mg | ORAL_SOLUTION | Freq: Four times a day (QID) | ORAL | Status: DC | PRN

## 2016-11-18 MED ORDER — PROPOFOL 10 MG/ML IV BOLUS
INTRAVENOUS | Status: AC
  Filled 2016-11-18: qty 20

## 2016-11-18 MED ORDER — KETOROLAC TROMETHAMINE 30 MG/ML IJ SOLN
30.0000 mg | Freq: Once | INTRAMUSCULAR | Status: AC
  Administered 2016-11-18: 30 mg via INTRAVENOUS

## 2016-11-18 MED ORDER — SODIUM CHLORIDE 0.9% FLUSH
9.0000 mL | INTRAVENOUS | Status: DC | PRN
Start: 2016-11-18 — End: 2016-11-19

## 2016-11-18 MED ORDER — BUPIVACAINE HCL (PF) 0.25 % IJ SOLN
INTRAMUSCULAR | Status: AC
  Filled 2016-11-18: qty 30

## 2016-11-18 MED ORDER — NALOXONE HCL 0.4 MG/ML IJ SOLN: 0.4000 mg | INTRAMUSCULAR | Status: DC | PRN

## 2016-11-18 MED ORDER — IBUPROFEN 800 MG PO TABS
400.0000 mg | ORAL_TABLET | Freq: Four times a day (QID) | ORAL | Status: DC | PRN
  Administered 2016-11-19: 400 mg via ORAL
  Filled 2016-11-18: qty 1

## 2016-11-18 MED ORDER — MIDAZOLAM HCL 2 MG/2ML IJ SOLN
INTRAMUSCULAR | Status: AC
  Filled 2016-11-18: qty 2

## 2016-11-18 MED ORDER — ONDANSETRON HCL 4 MG/2ML IJ SOLN
INTRAMUSCULAR | Status: AC
  Filled 2016-11-18: qty 2

## 2016-11-18 MED ORDER — SCOPOLAMINE 1 MG/3DAYS TD PT72
MEDICATED_PATCH | TRANSDERMAL | Status: AC
  Administered 2016-11-18: 1.5 mg via TRANSDERMAL
  Filled 2016-11-18: qty 1

## 2016-11-18 MED ORDER — DEXAMETHASONE SODIUM PHOSPHATE 10 MG/ML IJ SOLN
INTRAMUSCULAR | Status: AC
  Filled 2016-11-18: qty 1

## 2016-11-18 MED ORDER — SUGAMMADEX SODIUM 500 MG/5ML IV SOLN
INTRAVENOUS | Status: AC
  Filled 2016-11-18: qty 5

## 2016-11-18 MED ORDER — CEFAZOLIN SODIUM-DEXTROSE 2-4 GM/100ML-% IV SOLN: 2.0000 g | INTRAVENOUS | Status: DC

## 2016-11-18 MED ORDER — DIPHENHYDRAMINE HCL 50 MG/ML IJ SOLN: 12.5000 mg | Freq: Four times a day (QID) | INTRAMUSCULAR | Status: DC | PRN

## 2016-11-18 MED ORDER — PROMETHAZINE HCL 25 MG/ML IJ SOLN: 6.2500 mg | INTRAMUSCULAR | Status: DC | PRN

## 2016-11-18 MED ORDER — DIPHENHYDRAMINE HCL 50 MG/ML IJ SOLN
INTRAMUSCULAR | Status: DC | PRN
  Administered 2016-11-18: 25 mg via INTRAVENOUS

## 2016-11-18 MED ORDER — MIDAZOLAM HCL 5 MG/5ML IJ SOLN
INTRAMUSCULAR | Status: DC | PRN
  Administered 2016-11-18: 2 mg via INTRAVENOUS

## 2016-11-18 MED ORDER — LIDOCAINE HCL (CARDIAC) 20 MG/ML IV SOLN
INTRAVENOUS | Status: AC
  Filled 2016-11-18: qty 5

## 2016-11-18 MED ORDER — ONDANSETRON HCL 4 MG/2ML IJ SOLN
INTRAMUSCULAR | Status: DC | PRN
  Administered 2016-11-18: 4 mg via INTRAVENOUS

## 2016-11-18 MED ORDER — FENTANYL CITRATE (PF) 250 MCG/5ML IJ SOLN
INTRAMUSCULAR | Status: DC | PRN
  Administered 2016-11-18: 50 ug via INTRAVENOUS
  Administered 2016-11-18: 100 ug via INTRAVENOUS
  Administered 2016-11-18 (×2): 50 ug via INTRAVENOUS

## 2016-11-18 MED ORDER — LEVOTHYROXINE SODIUM 25 MCG PO TABS
25.0000 ug | ORAL_TABLET | Freq: Every day | ORAL | Status: DC
  Administered 2016-11-19 – 2016-11-20 (×2): 25 ug via ORAL
  Filled 2016-11-18 (×2): qty 1

## 2016-11-18 MED ORDER — DIPHENHYDRAMINE HCL 50 MG/ML IJ SOLN
INTRAMUSCULAR | Status: AC
  Filled 2016-11-18: qty 1

## 2016-11-18 MED ORDER — FENTANYL CITRATE (PF) 250 MCG/5ML IJ SOLN
INTRAMUSCULAR | Status: AC
  Filled 2016-11-18: qty 5

## 2016-11-18 MED ORDER — PHENYLEPHRINE 40 MCG/ML (10ML) SYRINGE FOR IV PUSH (FOR BLOOD PRESSURE SUPPORT)
PREFILLED_SYRINGE | INTRAVENOUS | Status: DC | PRN
  Administered 2016-11-18: 80 ug via INTRAVENOUS

## 2016-11-18 MED ORDER — DEXAMETHASONE SODIUM PHOSPHATE 10 MG/ML IJ SOLN
INTRAMUSCULAR | Status: DC | PRN
  Administered 2016-11-18: 10 mg via INTRAVENOUS

## 2016-11-18 MED ORDER — SODIUM CHLORIDE 0.9 % IV SOLN
INTRAVENOUS | Status: DC
  Administered 2016-11-18 – 2016-11-19 (×2): via INTRAVENOUS

## 2016-11-18 MED ORDER — KETOROLAC TROMETHAMINE 30 MG/ML IJ SOLN
INTRAMUSCULAR | Status: AC
  Administered 2016-11-18: 30 mg via INTRAVENOUS
  Filled 2016-11-18: qty 1

## 2016-11-18 MED ORDER — OXYCODONE-ACETAMINOPHEN 5-325 MG PO TABS: 1.0000 | ORAL_TABLET | ORAL | Status: DC | PRN

## 2016-11-18 MED ORDER — SCOPOLAMINE 1 MG/3DAYS TD PT72
1.0000 | MEDICATED_PATCH | Freq: Once | TRANSDERMAL | Status: DC
  Administered 2016-11-18: 1.5 mg via TRANSDERMAL

## 2016-11-18 MED ORDER — SUGAMMADEX SODIUM 200 MG/2ML IV SOLN
INTRAVENOUS | Status: AC
  Filled 2016-11-18: qty 2

## 2016-11-18 MED ORDER — HYDROMORPHONE HCL 1 MG/ML IJ SOLN
0.2500 mg | INTRAMUSCULAR | Status: DC | PRN
  Administered 2016-11-18: 0.25 mg via INTRAVENOUS
  Administered 2016-11-18: 0.5 mg via INTRAVENOUS

## 2016-11-18 MED ORDER — PHENYLEPHRINE 40 MCG/ML (10ML) SYRINGE FOR IV PUSH (FOR BLOOD PRESSURE SUPPORT)
PREFILLED_SYRINGE | INTRAVENOUS | Status: AC
  Filled 2016-11-18: qty 10

## 2016-11-18 MED ORDER — CEFAZOLIN SODIUM-DEXTROSE 2-4 GM/100ML-% IV SOLN
2.0000 g | INTRAVENOUS | Status: AC
  Administered 2016-11-18: 2 g via INTRAVENOUS

## 2016-11-18 MED ORDER — LACTATED RINGERS IV SOLN
INTRAVENOUS | Status: DC
  Administered 2016-11-18 (×4): via INTRAVENOUS

## 2016-11-18 SURGICAL SUPPLY — 50 items
BINDER ABDOMINAL 12 ML 46-62 (SOFTGOODS) ×2 IMPLANT
CANISTER SUCT 3000ML PPV (MISCELLANEOUS) ×2 IMPLANT
CLOTH BEACON ORANGE TIMEOUT ST (SAFETY) ×4 IMPLANT
CONT PATH 16OZ SNAP LID 3702 (MISCELLANEOUS) ×2 IMPLANT
DERMABOND ADVANCED (GAUZE/BANDAGES/DRESSINGS) ×1
DERMABOND ADVANCED .7 DNX12 (GAUZE/BANDAGES/DRESSINGS) ×1 IMPLANT
DRAPE CESAREAN BIRTH W POUCH (DRAPES) ×2 IMPLANT
DRAPE WARM FLUID 44X44 (DRAPE) IMPLANT
DRSG OPSITE POSTOP 4X10 (GAUZE/BANDAGES/DRESSINGS) ×2 IMPLANT
DURAPREP 26ML APPLICATOR (WOUND CARE) ×6 IMPLANT
ELECT REM PT RETURN 9FT ADLT (ELECTROSURGICAL) ×2
ELECTRODE REM PT RTRN 9FT ADLT (ELECTROSURGICAL) ×1 IMPLANT
GAUZE SPONGE 4X4 12PLY STRL LF (GAUZE/BANDAGES/DRESSINGS) ×2 IMPLANT
GAUZE SPONGE 4X4 16PLY XRAY LF (GAUZE/BANDAGES/DRESSINGS) ×2 IMPLANT
GLOVE BIO SURGEON STRL SZ7 (GLOVE) ×4 IMPLANT
GLOVE BIOGEL PI IND STRL 7.0 (GLOVE) ×8 IMPLANT
GLOVE BIOGEL PI INDICATOR 7.0 (GLOVE) ×8
GLOVE EUDERMIC 7 POWDERFREE (GLOVE) ×4 IMPLANT
GOWN STRL REUS W/TWL LRG LVL3 (GOWN DISPOSABLE) ×10 IMPLANT
NEEDLE HYPO 22GX1.5 SAFETY (NEEDLE) ×4 IMPLANT
NS IRRIG 1000ML POUR BTL (IV SOLUTION) ×4 IMPLANT
PACK ABDOMINAL GYN (CUSTOM PROCEDURE TRAY) ×4 IMPLANT
PAD ABD 8X7 1/2 STERILE (GAUZE/BANDAGES/DRESSINGS) ×2 IMPLANT
PAD OB MATERNITY 4.3X12.25 (PERSONAL CARE ITEMS) ×2 IMPLANT
PAD PREP 24X48 CUFFED NSTRL (MISCELLANEOUS) IMPLANT
PROTECTOR NERVE ULNAR (MISCELLANEOUS) ×2 IMPLANT
SPONGE LAP 18X18 X RAY DECT (DISPOSABLE) ×4 IMPLANT
SPONGE LAP 4X18 X RAY DECT (DISPOSABLE) IMPLANT
STAPLER VISISTAT 35W (STAPLE) IMPLANT
SUT MNCRL AB 4-0 PS2 18 (SUTURE) IMPLANT
SUT NOVA NAB DX-16 0-1 5-0 T12 (SUTURE) ×4 IMPLANT
SUT PLAIN 2 0 XLH (SUTURE) IMPLANT
SUT PROLENE 0 CT 1 30 (SUTURE) IMPLANT
SUT PROLENE 0 CT 1 CR/8 (SUTURE) IMPLANT
SUT PROLENE 0 SH 30 (SUTURE) IMPLANT
SUT SILK 3 0 SH CR/8 (SUTURE) IMPLANT
SUT VIC AB 0 CT1 18XCR BRD8 (SUTURE) ×3 IMPLANT
SUT VIC AB 0 CT1 36 (SUTURE) ×8 IMPLANT
SUT VIC AB 0 CT1 8-18 (SUTURE) ×3
SUT VIC AB 3-0 SH 18 (SUTURE) ×2 IMPLANT
SUT VIC AB 3-0 SH 27 (SUTURE)
SUT VIC AB 3-0 SH 27X BRD (SUTURE) IMPLANT
SUT VIC AB 4-0 KS 27 (SUTURE) IMPLANT
SUT VIC AB 4-0 SH 18 (SUTURE) IMPLANT
SUT VICRYL 0 TIES 12 18 (SUTURE) ×2 IMPLANT
SYR CONTROL 10ML LL (SYRINGE) ×4 IMPLANT
TAPE CLOTH SURG 4X10 WHT LF (GAUZE/BANDAGES/DRESSINGS) ×2 IMPLANT
TOWEL OR 17X24 6PK STRL BLUE (TOWEL DISPOSABLE) ×8 IMPLANT
TRAY FOLEY CATH SILVER 14FR (SET/KITS/TRAYS/PACK) ×2 IMPLANT
YANKAUER SUCT BULB TIP NO VENT (SUCTIONS) IMPLANT

## 2016-11-18 NOTE — H&P (Signed)
Sandra Yates is an 56 y.o. female with large abdominal mass from fibroids and having pelvic pain, back pain and causing pain from umbilical hernia due to pressure. Menopausal, no menses, no HRT.  Normal Pap history No breast problems and normal mammograms  No LMP recorded. Patient is not currently having periods (Reason: Irregular Periods).    Past Medical History:  Diagnosis Date  . Allergy   . Anemia   . Bilateral swelling of feet and ankles   . Dizziness   . Fibroids   . Hypothyroidism   . Insomnia   . Irregular heart beat    prior to thyroid medication  . Thyroid disease     Past Surgical History:  Procedure Laterality Date  . COLONOSCOPY    . DENTAL SURGERY    . DILATION AND CURETTAGE OF UTERUS    . radioactive iodine treatment for thyroid      Family History  Problem Relation Age of Onset  . Cancer Mother        lung primary/brain  . Diabetes Mother   . Cancer Father        ? lung  . Asthma Brother   . Diabetes Brother   . Diabetes Brother   . Kidney disease Brother   . Colon cancer Neg Hx     Social History:  reports that she has never smoked. She has never used smokeless tobacco. She reports that she drinks alcohol. She reports that she does not use drugs.  Allergies: No Known Allergies  Prescriptions Prior to Admission  Medication Sig Dispense Refill Last Dose  . acetaminophen (TYLENOL) 500 MG tablet Take 1,000 mg by mouth every 8 (eight) hours as needed for mild pain or headache.   Past Week at Unknown time  . Biotin 10000 MCG TABS Take 10,000 mcg by mouth daily.   11/17/2016 at Unknown time  . Ichthammol 20 % OINT Apply 1 application topically daily as needed (itching). Reported on 07/08/2015   Past Week at Unknown time  . levothyroxine (SYNTHROID, LEVOTHROID) 25 MCG tablet Take 1 tablet (25 mcg total) by mouth daily. (Patient taking differently: Take 25 mcg by mouth every evening. At 1800) 90 tablet 2 11/17/2016 at Unknown time  . Multiple  Vitamins-Minerals (WOMENS MULTI VITAMIN & MINERAL PO) Take 2 tablets by mouth daily.    11/17/2016 at Unknown time  . DiphenhydrAMINE HCl 50 MG/50ML LIQD Take 50 mg by mouth at bedtime as needed (sleep).   More than a month at Unknown time  . DM-Doxylamine-Acetaminophen 15-6.25-325 MG/15ML LIQD Take 15 mLs by mouth at bedtime as needed (sleep).   More than a month at Unknown time  . ibuprofen (ADVIL,MOTRIN) 200 MG tablet Take 400 mg by mouth every 6 (six) hours as needed for moderate pain.   Unknown at Unknown time  . oxyCODONE-acetaminophen (PERCOCET/ROXICET) 5-325 MG tablet Take 1-2 tablets by mouth every 4 (four) hours as needed for severe pain. (Patient not taking: Reported on 11/08/2016) 12 tablet 0 Not Taking at Unknown time    ROS  Blood pressure (!) 149/85, pulse 90, temperature 98 F (36.7 C), temperature source Oral, resp. rate 18, SpO2 100 %. Physical Exam  Physical exam:  A&O x 3, no acute distress. Pleasant HEENT neg, no thyromegaly Lungs CTA bilat CV RRR, S1S2 normal Abdo soft, non tender, non acute. Abdominal/pelvic mass of enlarged uterus with fibroids 18-20 wks  Extr no edema/ tenderness Pelvic Fibroid uterus   Imaging in Epic- reviewed   Assessment/Plan:  56 yo menopausal female with symptomatic uterine fibroids. Here for abdominal hysterectomy and bilateral salpingoophorectomy Removing ovaries as she is in symptomatic menopause and will start estrogen patch after 6 wk visit.  Risks/complications of surgery reviewed incl infection, bleeding, damage to internal organs including bladder, bowels, ureters, blood vessels, other risks from anesthesia, VTE and delayed complications of any surgery, complications in future surgery reviewed.  Patient will have Umbilical hernia surgery by Dr Dalbert Batman before I proceed with hysterectomy/    Torrey Ballinas R 11/18/2016, 12:07 PM

## 2016-11-18 NOTE — Anesthesia Postprocedure Evaluation (Signed)
Anesthesia Post Note  Patient: Sandra Yates  Procedure(s) Performed: Procedure(s) (LRB): HYSTERECTOMY ABDOMINAL WITH BILATERAL SALPINGECTOMY AND OOPHORECTOMY (N/A) HERNIA REPAIR UMBILICAL ADULT (N/A)     Patient location during evaluation: PACU Anesthesia Type: General Level of consciousness: sedated Pain management: pain level controlled Vital Signs Assessment: post-procedure vital signs reviewed and stable Respiratory status: spontaneous breathing and respiratory function stable Cardiovascular status: stable Anesthetic complications: no    Last Vitals:  Vitals:   11/18/16 1630 11/18/16 1645  BP: (!) 159/92 (!) 142/90  Pulse: 83 90  Resp: 20 (!) 25  Temp: 36.8 C     Last Pain:  Vitals:   11/18/16 1645  TempSrc:   PainSc: Asleep   Pain Goal: Patients Stated Pain Goal: 4 (11/18/16 1111)               Duane Boston DANIEL

## 2016-11-18 NOTE — Anesthesia Postprocedure Evaluation (Signed)
Anesthesia Post Note  Patient: Sandra Yates  Procedure(s) Performed: Procedure(s) (LRB): HYSTERECTOMY ABDOMINAL WITH BILATERAL SALPINGECTOMY AND OOPHORECTOMY (N/A) HERNIA REPAIR UMBILICAL ADULT (N/A)     Patient location during evaluation: Mother Baby Anesthesia Type: General Level of consciousness: awake and alert Pain management: pain level controlled Vital Signs Assessment: post-procedure vital signs reviewed and stable Respiratory status: spontaneous breathing, nonlabored ventilation and respiratory function stable Cardiovascular status: stable Postop Assessment: no headache, no backache and epidural receding Anesthetic complications: no    Last Vitals:  Vitals:   11/18/16 1926 11/18/16 2000  BP: (!) 159/91 (!) 154/89  Pulse:  87  Resp: (!) 24 (!) 21  Temp: 37.2 C 37.2 C    Last Pain:  Vitals:   11/18/16 2000  TempSrc: Oral  PainSc:    Pain Goal: Patients Stated Pain Goal: 4 (11/18/16 1840)               Gilmer Mor

## 2016-11-18 NOTE — Op Note (Signed)
Patient Name:           Sandra Yates   Date of Surgery:        11/18/2016  Pre op Diagnosis:      Incarcerated umbilical hernia  Post op Diagnosis:    Incarcerated umbilical hernia  Procedure:                 Primary repair incarcerated umbilical hernia with vest overpants technique  Surgeon:                     Edsel Petrin. Dalbert Batman, M.D., FACS  Assistant:                      OR staff   Indication for Assistant: N/A  Operative Indications:   The patient is a 56 year old female who is brought to the operating room electively for repair of umbilical hernia and hysterectomy by Dr Benjie Karvonen. .She is here with her husband throughout the counter. She is referred by Dr. Tyrell Antonio in the ED for abdominal pain, umbilical hernia. [name] is her PCP.      The patient states she's had a hernia at her umbilicus for 10 years. She presented to the emergency room with sharp lower abdominal pain for 3 days but no change in her bowel or bladder habits. No nausea or vomiting. A CT scan was performed which shows enlarged fibroid uterus 18 cm in size. Umbilical hernia containing fat and small bowel but no obstructive changes. She was discharged home. She continues to have no GI or GU symptoms but she does have pain at her umbilicus and in the lower abdomen. She says she has a menstrual period every 6 months.  It's been over 10 years. She's been advised to see a gynecologist before.  I referred her to Dr. Benjie Karvonen for evaluation of her enlarged uterus.      Comorbidities include uterine fibroids, dysfunctional uterine bleeding, hypothyroidism Family history reveals mother died of lung cancer. Father died of coronary artery disease.     I told her that her hernia was relatively small and reducible and probably not the cause of her pain. I told her that her  uterus is tender wherever I touch it and it is enlarged to about 20 weeks size. I told her that the cause of her pain is her enlarged uterine  fibroids.   After gynecologic evaluation she was advised to undergo abdominal hysterectomy and bilateral salpingo-oophorectomy.  She wanted to have the umbilical hernia repaired at the same time.  I told her that we would do a primary repair because and implantation of mesh with a class II wound was associated with significant infection risk.  She understands this and this was discussed in detail.  Operative Findings:       She had a partially incarcerated umbilical hernia.  There was some omentum and preperitoneal fat caught in the hernia sac.  This required somewhat tedious extensive debridement.  Once this was all completed the hernia defect was about 5 cm in diameter.  Procedure in Detail:          Following the induction of general endotracheal anesthesia a surgical timeout was performed.  Intravenous antibiotics were given.  Foley catheter was inserted.  The abdomen was prepped and draped in a sterile fashion.     I made a transverse curvilinear incision in the lower rim of the umbilicus.  This was about 7 or  8 cm in length because of the size of the hernia.  Dissection was carried down to the fascia.  I slowly debrided the hernia sac away from the umbilicus and the fascia.  Slowly debrided the incarcerated preperitoneal fat and omentum and edges of the hernia sac until I had clean healthy fascia circumferentially.  I undermined the subcutaneous tissue a little bit.      The fascial defect was closed transversely with 6 or 7 interrupted mattress sutures of #1 Novafil in a vest -over-pants technique.  After all of the sutures were placed we then lifted up on the sutures and the fascial defect closed nicely with a nice overlap.  We tied all of the sutures.  I then placed several other Novafil sutures to tack the upper edge of the fascia down to the lower fascia and is provided extremely secure closure.  The wound was irrigated.  The defect appeared to be securely closed.  The subcutaneous tissue was  closed with 3-0 Vicryl sutures and the skin was closed with a running subcuticular 4-0 Monocryl and Dermabond.  At this point the case the patient had done well.  There were no complications.  Sponge needle and his counts were correct.  EBL 20 mL.    At this point in the case I scrubbed out and Dr. Benjie Karvonen scrubbed in to assume control of the operative procedure which will be dictated separately.     Edsel Petrin. Dalbert Batman, M.D., FACS General and Minimally Invasive Surgery Breast and Colorectal Surgery  11/18/2016 2:02 PM

## 2016-11-18 NOTE — Anesthesia Preprocedure Evaluation (Addendum)
Anesthesia Evaluation  Patient identified by MRN, date of birth, ID band Patient awake    Reviewed: Allergy & Precautions, NPO status , Patient's Chart, lab work & pertinent test results  History of Anesthesia Complications Negative for: history of anesthetic complications  Airway Mallampati: II  TM Distance: >3 FB Neck ROM: Full    Dental no notable dental hx. (+) Dental Advisory Given   Pulmonary neg pulmonary ROS,    Pulmonary exam normal        Cardiovascular negative cardio ROS Normal cardiovascular exam     Neuro/Psych negative neurological ROS  negative psych ROS   GI/Hepatic negative GI ROS, Neg liver ROS,   Endo/Other  Hypothyroidism   Renal/GU negative Renal ROS  negative genitourinary   Musculoskeletal negative musculoskeletal ROS (+)   Abdominal   Peds negative pediatric ROS (+)  Hematology negative hematology ROS (+)   Anesthesia Other Findings Day of surgery medications reviewed with the patient.  Reproductive/Obstetrics negative OB ROS                            Anesthesia Physical Anesthesia Plan  ASA: II  Anesthesia Plan: General   Post-op Pain Management:    Induction: Intravenous  PONV Risk Score and Plan: 4 or greater and Ondansetron, Dexamethasone, Scopolamine patch - Pre-op and Diphenhydramine  Airway Management Planned: Oral ETT and LMA  Additional Equipment:   Intra-op Plan:   Post-operative Plan: Extubation in OR  Informed Consent: I have reviewed the patients History and Physical, chart, labs and discussed the procedure including the risks, benefits and alternatives for the proposed anesthesia with the patient or authorized representative who has indicated his/her understanding and acceptance.   Dental advisory given  Plan Discussed with: Anesthesiologist, CRNA and Surgeon  Anesthesia Plan Comments:        Anesthesia Quick Evaluation

## 2016-11-18 NOTE — Anesthesia Procedure Notes (Signed)
Procedure Name: Intubation Date/Time: 11/18/2016 12:33 PM Performed by: Talbot Grumbling Pre-anesthesia Checklist: Patient identified, Emergency Drugs available, Suction available and Patient being monitored Patient Re-evaluated:Patient Re-evaluated prior to inductionOxygen Delivery Method: Circle system utilized Preoxygenation: Pre-oxygenation with 100% oxygen Intubation Type: IV induction Ventilation: Mask ventilation without difficulty Laryngoscope Size: Miller and 2 Grade View: Grade I Tube type: Oral Tube size: 7.0 mm Number of attempts: 1 Airway Equipment and Method: Stylet Placement Confirmation: ETT inserted through vocal cords under direct vision,  positive ETCO2 and breath sounds checked- equal and bilateral Secured at: 21 cm Tube secured with: Tape Dental Injury: Teeth and Oropharynx as per pre-operative assessment

## 2016-11-18 NOTE — Op Note (Signed)
PRE-OPERATIVE DIAGNOSIS:  Uterine Fibroids   POST-OPERATIVE DIAGNOSIS:  Uterine Fibroids ( specimen weight 1040 gm)  PROCEDURE:  TOTAL ABDOMINAL HYSTERECTOMY, BILATERAL SALPINGECTOMY  SURGEON: Elveria Royals, MD  ASSISTANT:  Aloha Gell, CNM   ANESTHESIA:  General endotracheal  EBL: 400 cc  IVF: 3000 cc LR   Urine output: 400 cc clear urine in foley  BLOOD ADMINISTERED: NONE  SPECIMEN:  Uterus, cervix, bilateral fallopian tubes and ovaries    DISPOSITION OF SPECIMEN:  PATHOLOGY  COUNTS:  YES  PATIENT DISPOSITION:  PACU - hemodynamically stable. Then admit for post-op care.   Delay start of Pharmacological VTE agent (>24hrs) due to surgical blood loss or risk of bleeding: yes  PROCEDURE:   Indication:  Symptomatic uterine fibroids with abdominal mass, pain. Risks and complications of surgery including infection, bleeding, damage to internal organs and other including but not limited to surgery related problems including pneumonia, VTE reviewed. Informed written consent was obtained.   Patient was brought to the operating room with IV running. She received 2 gm Ancef. Underwent general anesthesia without difficulty and was given dorsal supine position, prepped and draped in sterile fashion. Foley catheter was placed. Exam under anesthesia noted uterus at the umbilicus but mobile.   Dr Dalbert Batman proceeded to performed Umbilical hernia repair first.   I scrubbed in after he finished and Dr Pamala Hurry assisted.  A Pfannenstiel incision was made with scalpel and carried down to the underlying fascia with Bovie with excellent hemostasis.  Fascia incised and extended laterally. Fascia grasped with Kocher's and underlying rectus muscles were dissected down. Rectus muscles were separated in midline. Posterior rectus sheath and posterior peritoneum was grasped with mosquitoes and peritoneal entry made. Large fibroid uterus was palpated. No adhesions noted. Upper abdomen felt normal. Uterus  was delivered out of the incision with manipulation. Fallopian tubes and both ovaries appear normal. Bilateral round ligaments were grasped, incised and suture ligated with 0 Vicryl and mosquito clamps placed at the ends. Anterior broad ligament was dissected and cut to creat bladder flap. Bladder was pushed further away with blunt dissection with lap sponge. A window was created in posterior broad ligament and right Infundibulopelvic ligament was doubly clamped, cut in between and suture transfixed with 0-Vicryl. Left broad ligament was dissected and left I-P ligament was doubly clamped and cut and suture transfixed. Posterior broad ligament was dissected bilaterally and uterine vessels were skeletonized. Bilateral uterine vessels were clamped with Heaney clamps and cut and transfixed with 0 Vicryl. Uterus was amputated above the cervix and passed off and cervical stump was grasped with Kocher clamps. Hemostasis was observed. Straight Heaney clamps applied bilaterally, Cardinal ligaments cut and transfixed with 0 Vicryl. Bilateral curved Heaney's applied to vaginal angles with Uterosacral ligament and cut and transfixed. Vaginal opening noted, was cut circumferentially. Vaginal angles were transfixed with 0-Vicryl and vaginal cut was closed from one angle to the other with 0 Vicryl interlocking sutures with excellent hemostasis.  Suction irrigation done. Hemostasis observed.  Peritoneal edges grasped and peritoneum closed with 2-0 Vicryl. Fascia sutured with 0 Vicryl from two ends and met in midline. Subcutaneous layer was deep and closed with 2-0 Plain gut. Skin approximated with 4-0 Vicryl in subcuticular fashion.  Sterile dressing placed.  Sterile Honeycomb dressing placed.  All  Instruments/ lap/ sponges counts were correct x2. No complications.  Dr Benjie Karvonen was the surgeon for entire case.

## 2016-11-18 NOTE — Transfer of Care (Signed)
Immediate Anesthesia Transfer of Care Note  Patient: Sandra Yates  Procedure(s) Performed: Procedure(s) with comments: HYSTERECTOMY ABDOMINAL WITH SALPINGECTOMY (N/A) - Dr. Dalbert Batman is going first [place] (N/A)  Patient Location: PACU  Anesthesia Type:General  Level of Consciousness:  sedated, patient cooperative and responds to stimulation  Airway & Oxygen Therapy:Patient Spontanous Breathing and Patient connected to face mask oxgen  Post-op Assessment:  Report given to PACU RN and Post -op Vital signs reviewed and stable  Post vital signs:  Reviewed and stable  Last Vitals:  Vitals:   11/18/16 1111  BP: (!) 149/85  Pulse: 90  Resp: 18  Temp: 81.0 C    Complications: No apparent anesthesia complications

## 2016-11-18 NOTE — Discharge Instructions (Addendum)
[facility] _____[facility]  UMBILICAL  HERNIA REPAIR: POST OP INSTRUCTIONS  Always review your discharge instruction sheet given to you by the facility where your surgery was performed. IF YOU HAVE DISABILITY OR FAMILY LEAVE FORMS, YOU MUST BRING THEM TO THE OFFICE FOR PROCESSING.   DO NOT GIVE THEM TO YOUR DOCTOR.  1. A  prescription for pain medication may be given to you upon discharge.  Take your pain medication as prescribed, if needed.  If narcotic pain medicine is not needed, then you may take acetaminophen (Tylenol) or ibuprofen (Advil) as needed. 2. Take your usually prescribed medications unless otherwise directed. If you need a refill on your pain medication, please contact your pharmacy.  They will contact our office to request authorization. Prescriptions will not be filled after 5 pm or on week-ends. 3. You should follow a light diet the first 24 hours after arrival home, such as soup and crackers, etc.  Be sure to include lots of fluids daily.  Resume your normal diet the day after surgery. 4.Most patients will experience some swelling and bruising around the umbilicus .  Ice packs and reclining will help.  Swelling and bruising can take several days to resolve.  6. It is common to experience some constipation if taking pain medication after surgery.  Increasing fluid intake and taking a stool softener (such as Colace) will usually help or prevent this problem from occurring.  A mild laxative (Milk of Magnesia or Miralax) should be taken according to package directions if there are no bowel movements after 48 hours. 7. Unless discharge instructions indicate otherwise, you may remove your bandages 24-48 hours after surgery, and you may shower at that time.  You may have steri-strips (small skin tapes) in place directly over the incision.  These strips should be left on the skin for 7-10 days.  If your surgeon used skin glue on the incision, you may shower in 24 hours.  The  glue will flake off over the next 2-3 weeks.  Any sutures or staples will be removed at the office during your follow-up visit. 8. ACTIVITIES:  You may resume regular (light) daily activities beginning the next day--such as daily self-care, walking, climbing stairs--gradually increasing activities as tolerated.  You may have sexual intercourse when it is comfortable.  Refrain from any heavy lifting or straining until approved by your doctor. No lifting more than 15 lbs for 5 weeks  a.You may drive when you are no longer taking prescription pain medication, you can comfortably wear a seatbelt, and you can safely maneuver your car and apply brakes. b.RETURN TO WORK:   _____________________________________________  9.You should see your doctor in the office for a follow-up appointment approximately 2-3 weeks after your surgery.  Make sure that you call for this appointment within a day or two after you arrive home to insure a convenient appointment time. 10.OTHER INSTRUCTIONS: _________________________    _____________________________________  WHEN TO CALL YOUR DOCTOR: 1. Fever over 101.0 2. Inability to urinate 3. Nausea and/or vomiting 4. Extreme swelling or bruising 5. Continued bleeding from incision. 6. Increased pain, redness, or drainage from the incision  The clinic staff is available to answer your questions during regular business hours.  Please dont hesitate to call and ask to speak to one of the nurses for clinical concerns.  If you have a medical emergency, go to the nearest emergency room or call 911.  A surgeon from [facility] is always on call at the  hospital   [address] [place], [place], [place]  [address] ?  [address], [place], [place]   [address] [phone] ? [phone] ? FAX [phone] Web site: [email or web address]    Abdominal Hysterectomy, Care After This sheet gives you information about how to care for yourself after  your procedure. Your doctor may also give you more specific instructions. If you have problems or questions, contact your doctor. Follow these instructions at home: Bathing  Do not take baths, swim, or use a hot tub until your doctor says it is okay. Ask your doctor if you can take showers. You may only be allowed to take sponge baths for bathing.  Keep the bandage (dressing) dry until your doctor says it can be taken off. Surgical cut ( incision) care  Follow instructions from your doctor about how to take care of your cut from surgery. Make sure you: ? Wash your hands with soap and water before you change your bandage (dressing). If you cannot use soap and water, use hand sanitizer. ? Change your bandage as told by your doctor. ? Leave stitches (sutures), skin glue, or skin tape (adhesive) strips in place. They may need to stay in place for 2 weeks or longer. If tape strips get loose and curl up, you may trim the loose edges. Do not remove tape strips completely unless your doctor says it is okay.  Check your surgical cut area every day for signs of infection. Check for: ? Redness, swelling, or pain. ? Fluid or blood. ? Warmth. ? Pus or a bad smell. Activity  Do gentle, daily exercise as told by your doctor. You may be told to take short walks every day and go farther each time.  Do not lift anything that is heavier than 10 lb (4.5 kg), or the limit that your doctor tells you, until he or she says that it is safe.  Do not drive or use heavy machinery while taking prescription pain medicine.  Do not drive for 24 hours if you were given a medicine to help you relax (sedative).  Follow your doctor's advice about exercise, driving, and general activities. Ask your doctor what activities are safe for you. Lifestyle  Do not douche, use tampons, or have sex for at least 6 weeks or as told by your doctor.  Do not drink alcohol until your doctor says it is okay.  Drink enough fluid to  keep your pee (urine) clear or pale yellow.  Try to have someone at home with you for the first 1-2 weeks to help.  Do not use any products that contain nicotine or tobacco, such as cigarettes and e-cigarettes. These can slow down healing. If you need help quitting, ask your doctor. General instructions  Take over-the-counter and prescription medicines only as told by your doctor.  Do not take aspirin or ibuprofen. These medicines can cause bleeding.  To prevent or treat constipation while you are taking prescription pain medicine, your doctor may suggest that you: ? Drink enough fluid to keep your urine clear or pale yellow. ? Take over-the-counter or prescription medicines. ? Eat foods that are high in fiber, such as:  Fresh fruits and vegetables.  Whole grains.  Beans. ? Limit foods that are high in fat and processed sugars, such as fried and sweet foods.  Keep all follow-up visits as told by your doctor. This is important. Contact a doctor if:  You have chills or fever.  You have redness, swelling, or  pain around your cut.  You have fluid or blood coming from your cut.  Your cut feels warm to the touch.  You have pus or a bad smell coming from your cut.  Your cut breaks open.  You feel dizzy or light-headed.  You have pain or bleeding when you pee.  You keep having watery poop (diarrhea).  You keep feeling sick to your stomach (nauseous) or keep throwing up (vomiting).  You have unusual fluid (discharge) coming from your vagina.  You have a rash.  You have a reaction to your medicine.  Your pain medicine does not help. Get help right away if:  You have a fever and your symptoms get worse all of a sudden.  You have very bad belly (abdominal) pain.  You are short of breath.  You pass out (faint).  You have pain, swelling, or redness of your leg.  You bleed a lot from your vagina and notice clumps of blood (clots). Summary  Do not take baths, swim,  or use a hot tub until your doctor says it is okay. Ask your doctor if you can take showers. You may only be allowed to take sponge baths for bathing.  Follow your doctor's advice about exercise, driving, and general activities. Ask your doctor what activities are safe for you.  Do not lift anything that is heavier than 10 lb (4.5 kg), or the limit that your doctor tells you, until he or she says that it is safe.  Try to have someone at home with you for the first 1-2 weeks to help. This information is not intended to replace advice given to you by your health care provider. Make sure you discuss any questions you have with your health care provider. Document Released: 02/16/2008 Document Revised: 04/27/2016 Document Reviewed: 04/27/2016 Elsevier Interactive Patient Education  2017 [employer].

## 2016-11-18 NOTE — Addendum Note (Signed)
Addendum  created 11/18/16 2102 by Alvy Bimler, CRNA   Sign clinical note

## 2016-11-19 ENCOUNTER — Encounter (HOSPITAL_COMMUNITY): Payer: Self-pay | Admitting: Obstetrics & Gynecology

## 2016-11-19 MED ORDER — OXYCODONE-ACETAMINOPHEN 5-325 MG PO TABS
1.0000 | ORAL_TABLET | Freq: Four times a day (QID) | ORAL | Status: DC | PRN
  Administered 2016-11-19 – 2016-11-20 (×3): 2 via ORAL
  Filled 2016-11-19 (×3): qty 2

## 2016-11-19 NOTE — Progress Notes (Signed)
1 Day Post-Op Procedure(s) (LRB): HYSTERECTOMY ABDOMINAL WITH BILATERAL SALPINGECTOMY AND OOPHORECTOMY (N/A) HERNIA REPAIR UMBILICAL ADULT (N/A)  Subjective: Patient reports incisional pain, tolerating PO and no problems voiding.   Ambulating.   Objective: BP 119/68 (BP Location: Right Arm)   Pulse 81   Temp 97.9 F (36.6 C) (Oral)   Resp 17   SpO2 100%   I have reviewed patient's intake and output, medications and labs. General: alert and cooperative Resp: clear to auscultation bilaterally Cardio: regular rate and rhythm, S1, S2 normal, no murmur, click, rub or gallop GI: soft, non-tender; bowel sounds normal; no masses,  no organomegaly Extremities: extremities normal, atraumatic, no cyanosis or edema  No clinical DVT  CBC Latest Ref Rng & Units 11/18/2016 11/10/2016 07/28/2016  WBC 4.0 - 10.5 K/uL 13.1(H) 4.3 4.2  Hemoglobin 12.0 - 15.0 g/dL 9.6(L) 10.6(L) 11.1(L)  Hematocrit 36.0 - 46.0 % 32.1(L) 35.7(L) 34.5(L)  Platelets 150 - 400 K/uL 175 182 184   Assessment: s/p Procedure(s) with comments: HYSTERECTOMY ABDOMINAL WITH BILATERAL SALPINGECTOMY AND OOPHORECTOMY (N/A) - Dr. Dalbert Batman is going first [place] (N/A): stable, progressing well, tolerating diet and ambulating   Plan: Advance diet Encourage ambulation Advance to PO medication Discontinue IV fluids anticipate D/c tomorrow   LOS: 1 day    Jojuan Champney R 11/19/2016, 10:55 AM

## 2016-11-20 MED ORDER — OXYCODONE-ACETAMINOPHEN 5-325 MG PO TABS: 1.0000 | ORAL_TABLET | Freq: Four times a day (QID) | ORAL | 0 refills | Status: DC | PRN

## 2016-11-20 NOTE — Progress Notes (Signed)
2 Days Post-Op Procedure(s) (LRB): HYSTERECTOMY ABDOMINAL WITH BILATERAL SALPINGECTOMY AND OOPHORECTOMY (N/A) HERNIA REPAIR UMBILICAL ADULT (N/A)  Subjective: Patient reports no complaints. Ambulating, eating, flatus +, no n/v, pain well controlled, no vag bleeding    Objective: BP 120/62 (BP Location: Left Arm)   Pulse 94   Temp 98.5 F (36.9 C) (Oral)   Resp 17   Ht 5\' 6"  (1.676 m)   Wt 158 lb 6 oz (71.8 kg)   SpO2 100%   BMI 25.56 kg/m   I have reviewed patient's intake and output, medications and labs. General: alert and cooperative Resp: clear to auscultation bilaterally Cardio: regular rate and rhythm, S1, S2 normal, no murmur, click, rub or gallop GI: soft, non-tender; bowel sounds normal; no masses,  no organomegaly Extremities: extremities normal, atraumatic, no cyanosis or edema  No clinical DVT  Assessment: s/p Procedure(s) with comments: HYSTERECTOMY ABDOMINAL WITH BILATERAL SALPINGECTOMY AND OOPHORECTOMY (N/A) - Dr. Dalbert Batman is going first [place] (N/A):  Post-op Day 2.   Plan:  LOS: 2 days  Discharge home Post-op care reviewed Remove Honeycomb dressing on Wed- 11/23/16  Sandra Yates R 11/20/2016, 7:30 AM

## 2016-11-20 NOTE — Progress Notes (Signed)
Patient discharged home with husband. Discharge instructions, paperwork, home care, follow-up appts, and prescriptions reviewed. No questions at this time.

## 2016-11-20 NOTE — Discharge Summary (Signed)
Physician Discharge Summary  Patient ID: NOVEMBER SYPHER MRN: [id number] DOB/AGE: 01-10-1961 56 y.o.  Admit date: 11/18/2016 Discharge date: 11/20/2016  Admission Diagnoses: Large fibroid uterus with pain                                         Umbilical hernia with pain   Discharge Diagnoses: Status post TAH/BSO (Dr Benjie Karvonen)                                         Status post Umbilical hernia repair (Dr Dalbert Batman)  Discharged Condition: good  Hospital Course: Uncomplicated surgery, post-op recovery uneventful. Ambulating, voiding, passing flatus, tolerating regular diet and pain well controlled with medication.    Discharge Exam: Blood pressure 125/74, pulse 84, temperature 98.6 F (37 C), temperature source Oral, resp. rate 18, height 5\' 6"  (1.676 m), weight 158 lb 6 oz (71.8 kg), SpO2 100 %. General appearance: alert and cooperative Resp: clear to auscultation bilaterally Cardio: regular rate and rhythm, S1, S2 normal, no murmur, click, rub or gallop GI: soft, non-tender; bowel sounds normal; no masses,  no organomegaly Incision/Wound: clean and dry and intact, no erythema  Extr: no tenderness or clinical DVT  CBC - post-operative    Component Value Date/Time   WBC 13.1 (H) 11/18/2016 1640   RBC 4.14 11/18/2016 1640   HGB 9.6 (L) 11/18/2016 1640   HCT 32.1 (L) 11/18/2016 1640   PLT 175 11/18/2016 1640   MCV 77.5 (L) 11/18/2016 1640   MCH 23.2 (L) 11/18/2016 1640   MCHC 29.9 (L) 11/18/2016 1640   RDW 16.5 (H) 11/18/2016 1640   LYMPHSABS 1.8 05/20/2009 0000   MONOABS 0.3 05/20/2009 0000   EOSABS 0.1 05/20/2009 0000   BASOSABS 0.0 05/20/2009 0000    Disposition: 01-Home or Self Care  Discharge Instructions    Call MD for:    Complete by:  As directed    Vaginal bleedng   Call MD for:  difficulty breathing, headache or visual disturbances    Complete by:  As directed    Call MD for:  persistant nausea and vomiting    Complete by:  As directed    Call MD for:  redness,  tenderness, or signs of infection (pain, swelling, redness, odor or green/yellow discharge around incision site)    Complete by:  As directed    Call MD for:  severe uncontrolled pain    Complete by:  As directed    Call MD for:  temperature >100.4    Complete by:  As directed    Diet - low sodium heart healthy    Complete by:  As directed    Discharge instructions    Complete by:  As directed    See Dr Benjie Karvonen in 2 wks  See Dr Dalbert Batman in 3 wks   Increase activity slowly    Complete by:  As directed      Allergies as of 11/20/2016   No Known Allergies     Medication List    TAKE these medications   acetaminophen 500 MG tablet Commonly known as:  TYLENOL Take 1,000 mg by mouth every 8 (eight) hours as needed for mild pain or headache.   Biotin 10000 MCG Tabs Take 10,000 mcg by mouth daily.   DiphenhydrAMINE HCl  50 MG/50ML Liqd Take 50 mg by mouth at bedtime as needed (sleep).   DM-Doxylamine-Acetaminophen 15-6.25-325 MG/15ML Liqd Take 15 mLs by mouth at bedtime as needed (sleep).   ibuprofen 200 MG tablet Commonly known as:  ADVIL,MOTRIN Take 400 mg by mouth every 6 (six) hours as needed for moderate pain.   Ichthammol 20 % Oint Apply 1 application topically daily as needed (itching). Reported on 07/08/2015   levothyroxine 25 MCG tablet Commonly known as:  SYNTHROID, LEVOTHROID Take 1 tablet (25 mcg total) by mouth daily. What changed:  when to take this  additional instructions   oxyCODONE-acetaminophen 5-325 MG tablet Commonly known as:  PERCOCET/ROXICET Take 1-2 tablets by mouth every 4 (four) hours as needed for severe pain. What changed:  Another medication with the same name was added. Make sure you understand how and when to take each.   oxyCODONE-acetaminophen 5-325 MG tablet Commonly known as:  PERCOCET/ROXICET Take 1-2 tablets by mouth every 6 (six) hours as needed for moderate pain. What changed:  You were already taking a medication with the same name,  and this prescription was added. Make sure you understand how and when to take each.   WOMENS MULTI VITAMIN & MINERAL PO Take 2 tablets by mouth daily.      Follow-up Information    Fanny Skates, MD. Schedule an appointment as soon as possible for a visit in 3 day(s).   Specialty:  General Surgery Contact information: [address] [place] [address] [phone]        Azucena Fallen, MD Follow up in 2 week(s).   Specialty:  Obstetrics and Gynecology Contact information: [address] [place] [address] [phone]           Signed: Elveria Royals 11/20/2016, 8:53 AM

## 2016-12-30 ENCOUNTER — Telehealth: Payer: Self-pay | Admitting: *Deleted

## 2016-12-30 MED ORDER — LEVOTHYROXINE SODIUM 25 MCG PO TABS: 25.0000 ug | ORAL_TABLET | Freq: Every day | ORAL | 0 refills | Status: DC

## 2016-12-30 NOTE — Telephone Encounter (Signed)
Pt was transferred to triage left msg requesting refill on her thyroid medication. Per chart pt is due for annual appt will only send 30 day supply until she is able to come in for her appt. pls call pt and make appt within 30 days for future refills...Johny Chess

## 2017-01-02 NOTE — Telephone Encounter (Signed)
Patient scheduled 9/13

## 2017-01-31 MED ORDER — LEVOTHYROXINE SODIUM 25 MCG PO TABS: 25.0000 ug | ORAL_TABLET | Freq: Every day | ORAL | 0 refills | Status: DC

## 2017-01-31 NOTE — Addendum Note (Signed)
Addended by: Earnstine Regal on: 01/31/2017 03:27 PM   Modules accepted: Orders
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
## 2017-01-31 NOTE — Telephone Encounter (Signed)
Per office policy sent 30 day to local pharmacy until appt.../lmb  

## 2017-02-02 ENCOUNTER — Encounter: Payer: BLUE CROSS/BLUE SHIELD | Admitting: Internal Medicine

## 2017-02-23 ENCOUNTER — Ambulatory Visit (INDEPENDENT_AMBULATORY_CARE_PROVIDER_SITE_OTHER): Payer: BLUE CROSS/BLUE SHIELD | Admitting: Internal Medicine

## 2017-02-23 ENCOUNTER — Other Ambulatory Visit (INDEPENDENT_AMBULATORY_CARE_PROVIDER_SITE_OTHER): Payer: BLUE CROSS/BLUE SHIELD

## 2017-02-23 ENCOUNTER — Encounter: Payer: Self-pay | Admitting: Internal Medicine

## 2017-02-23 VITALS — BP 122/84 | HR 77 | Temp 98.5°F | Ht 66.0 in | Wt 156.0 lb

## 2017-02-23 DIAGNOSIS — D500 Iron deficiency anemia secondary to blood loss (chronic): Secondary | ICD-10-CM

## 2017-02-23 DIAGNOSIS — R7989 Other specified abnormal findings of blood chemistry: Secondary | ICD-10-CM

## 2017-02-23 DIAGNOSIS — Z0000 Encounter for general adult medical examination without abnormal findings: Secondary | ICD-10-CM

## 2017-02-23 DIAGNOSIS — Z87410 Personal history of cervical dysplasia: Secondary | ICD-10-CM | POA: Diagnosis not present

## 2017-02-23 DIAGNOSIS — E039 Hypothyroidism, unspecified: Secondary | ICD-10-CM | POA: Diagnosis not present

## 2017-02-23 LAB — CBC
HCT: 33.5 % — ABNORMAL LOW (ref 36.0–46.0)
HEMOGLOBIN: 10.4 g/dL — AB (ref 12.0–15.0)
MCHC: 31.1 g/dL (ref 30.0–36.0)
MCV: 73.4 fl — ABNORMAL LOW (ref 78.0–100.0)
PLATELETS: 218 10*3/uL (ref 150.0–400.0)
RBC: 4.56 Mil/uL (ref 3.87–5.11)
RDW: 16.6 % — ABNORMAL HIGH (ref 11.5–15.5)
WBC: 4.8 10*3/uL (ref 4.0–10.5)

## 2017-02-23 LAB — COMPREHENSIVE METABOLIC PANEL
ALT: 32 U/L (ref 0–35)
AST: 31 U/L (ref 0–37)
Albumin: 4.3 g/dL (ref 3.5–5.2)
Alkaline Phosphatase: 73 U/L (ref 39–117)
BUN: 8 mg/dL (ref 6–23)
CALCIUM: 10.3 mg/dL (ref 8.4–10.5)
CHLORIDE: 103 meq/L (ref 96–112)
CO2: 26 mEq/L (ref 19–32)
CREATININE: 0.77 mg/dL (ref 0.40–1.20)
GFR: 100.43 mL/min (ref >60.00)
Glucose, Bld: 91 mg/dL (ref 70–99)
Potassium: 3.9 mEq/L (ref 3.5–5.1)
Sodium: 138 mEq/L (ref 135–145)
Total Bilirubin: 0.5 mg/dL (ref 0.2–1.2)
Total Protein: 8.4 g/dL — ABNORMAL HIGH (ref 6.0–8.3)

## 2017-02-23 LAB — LIPID PANEL
CHOL/HDL RATIO: 8
CHOLESTEROL: 321 mg/dL — AB (ref 0–200)
HDL: 41.9 mg/dL (ref >39.00)

## 2017-02-23 LAB — T4, FREE: FREE T4: 0.79 ng/dL (ref 0.60–1.60)

## 2017-02-23 LAB — TSH: TSH: 10.34 u[IU]/mL — ABNORMAL HIGH (ref 0.35–4.50)

## 2017-02-23 LAB — LDL CHOLESTEROL, DIRECT: LDL DIRECT: 164 mg/dL

## 2017-02-23 NOTE — Assessment & Plan Note (Signed)
>  10 years ago and normal since, with recent hysterectomy does not require pap screening any longer.

## 2017-02-23 NOTE — Assessment & Plan Note (Signed)
Declines flu shot. Not needing pap smear any longer. Tetanus and colonoscopy up to date. Counseled on shingrix and declines today. Counseled on sun safety and mole surveillance. Given screening recommendations.

## 2017-02-23 NOTE — Assessment & Plan Note (Signed)
Synthroid 25 mcg daily and checking TSH and free T4 and adjust dosing as needed.

## 2017-02-23 NOTE — Assessment & Plan Note (Signed)
Checking CBC. Recent hysterectomy which should help with avoidance of blood loss.

## 2017-02-23 NOTE — Progress Notes (Signed)
   Subjective:    Patient ID: Sandra Yates, female    DOB: 12-12-1960, 56 y.o.   MRN: [id number]  HPI The patient is a 56 YO female coming in for wellness. Recent hysterectomy for fibroids.   PMH, [facility], social history reviewed and updated.   Review of Systems  Constitutional: Negative.   HENT: Negative.   Eyes: Negative.   Respiratory: Negative for cough, chest tightness and shortness of breath.   Cardiovascular: Negative for chest pain, palpitations and leg swelling.  Gastrointestinal: Negative for abdominal distention, abdominal pain, constipation, diarrhea, nausea and vomiting.  Musculoskeletal: Negative.   Skin: Negative.   Neurological: Negative.   Psychiatric/Behavioral: Negative.       Objective:   Physical Exam  Constitutional: She is oriented to person, place, and time. She appears well-developed and well-nourished.  HENT:  Head: Normocephalic and atraumatic.  Eyes: EOM are normal.  Neck: Normal range of motion.  Cardiovascular: Normal rate and regular rhythm.   Pulmonary/Chest: Effort normal and breath sounds normal. No respiratory distress. She has no wheezes. She has no rales.  Abdominal: Soft. Bowel sounds are normal. She exhibits no distension. There is no tenderness. There is no rebound.  Some mild tenderness low abdomen at surgical site which is healed.   Musculoskeletal: She exhibits no edema.  Neurological: She is alert and oriented to person, place, and time. Coordination normal.  Skin: Skin is warm and dry.  Psychiatric: She has a normal mood and affect.   Vitals:   02/23/17 0941  BP: 122/84  Pulse: 77  Temp: 98.5 F (36.9 C)  TempSrc: Oral  SpO2: 98%  Weight: 156 lb (70.8 kg)  Height: 5\' 6"  (1.676 m)      Assessment & Plan:

## 2017-02-23 NOTE — Patient Instructions (Signed)
We will check the labs today and call you back with the results.    Health Maintenance, Female Adopting a healthy lifestyle and getting preventive care can go a long way to promote health and wellness. Talk with your health care provider about what schedule of regular examinations is right for you. This is a good chance for you to check in with your provider about disease prevention and staying healthy. In between checkups, there are plenty of things you can do on your own. Experts have done a lot of research about which lifestyle changes and preventive measures are most likely to keep you healthy. Ask your health care provider for more information. Weight and diet Eat a healthy diet  Be sure to include plenty of vegetables, fruits, low-fat dairy products, and lean protein.  Do not eat a lot of foods high in solid fats, added sugars, or salt.  Get regular exercise. This is one of the most important things you can do for your health. ? Most adults should exercise for at least 150 minutes each week. The exercise should increase your heart rate and make you sweat (moderate-intensity exercise). ? Most adults should also do strengthening exercises at least twice a week. This is in addition to the moderate-intensity exercise.  Maintain a healthy weight  Body mass index (BMI) is a measurement that can be used to identify possible weight problems. It estimates body fat based on height and weight. Your health care provider can help determine your BMI and help you achieve or maintain a healthy weight.  For females 20 years of age and older: ? A BMI below 18.5 is considered underweight. ? A BMI of 18.5 to 24.9 is normal. ? A BMI of 25 to 29.9 is considered overweight. ? A BMI of 30 and above is considered obese.  Watch levels of cholesterol and blood lipids  You should start having your blood tested for lipids and cholesterol at 54 years of age, then have this test every 5 years.  You may need to  have your cholesterol levels checked more often if: ? Your lipid or cholesterol levels are high. ? You are older than 54 years of age. ? You are at high risk for heart disease.  Cancer screening Lung Cancer  Lung cancer screening is recommended for adults 55-80 years old who are at high risk for lung cancer because of a history of smoking.  A yearly low-dose CT scan of the lungs is recommended for people who: ? Currently smoke. ? Have quit within the past 15 years. ? Have at least a 30-pack-year history of smoking. A pack year is smoking an average of one pack of cigarettes a day for 1 year.  Yearly screening should continue until it has been 15 years since you quit.  Yearly screening should stop if you develop a health problem that would prevent you from having lung cancer treatment.  Breast Cancer  Practice breast self-awareness. This means understanding how your breasts normally appear and feel.  It also means doing regular breast self-exams. Let your health care provider know about any changes, no matter how small.  If you are in your 20s or 30s, you should have a clinical breast exam (CBE) by a health care provider every 1-3 years as part of a regular health exam.  If you are 40 or older, have a CBE every year. Also consider having a breast X-ray (mammogram) every year.  If you have a family history of breast cancer,   to your health care provider about genetic screening.  If you are at high risk for breast cancer, talk to your health care provider about having an MRI and a mammogram every year.  Breast cancer gene (BRCA) assessment is recommended for women who have family members with BRCA-related cancers. BRCA-related cancers include: ? Breast. ? Ovarian. ? Tubal. ? Peritoneal cancers.  Results of the assessment will determine the need for genetic counseling and BRCA1 and BRCA2 testing.  Cervical Cancer Your health care provider may recommend that you be screened  regularly for cancer of the pelvic organs (ovaries, uterus, and vagina). This screening involves a pelvic examination, including checking for microscopic changes to the surface of your cervix (Pap test). You may be encouraged to have this screening done every 3 years, beginning at age 21.  For women ages 30-65, health care providers may recommend pelvic exams and Pap testing every 3 years, or they may recommend the Pap and pelvic exam, combined with testing for human papilloma virus (HPV), every 5 years. Some types of HPV increase your risk of cervical cancer. Testing for HPV may also be done on women of any age with unclear Pap test results.  Other health care providers may not recommend any screening for nonpregnant women who are considered low risk for pelvic cancer and who do not have symptoms. Ask your health care provider if a screening pelvic exam is right for you.  If you have had past treatment for cervical cancer or a condition that could lead to cancer, you need Pap tests and screening for cancer for at least 20 years after your treatment. If Pap tests have been discontinued, your risk factors (such as having a new sexual partner) need to be reassessed to determine if screening should resume. Some women have medical problems that increase the chance of getting cervical cancer. In these cases, your health care provider may recommend more frequent screening and Pap tests.  Colorectal Cancer  This type of cancer can be detected and often prevented.  Routine colorectal cancer screening usually begins at 54 years of age and continues through 54 years of age.  Your health care provider may recommend screening at an earlier age if you have risk factors for colon cancer.  Your health care provider may also recommend using home test kits to check for hidden blood in the stool.  A small camera at the end of a tube can be used to examine your colon directly (sigmoidoscopy or colonoscopy). This is  done to check for the earliest forms of colorectal cancer.  Routine screening usually begins at age 50.  Direct examination of the colon should be repeated every 5-10 years through 54 years of age. However, you may need to be screened more often if early forms of precancerous polyps or small growths are found.  Skin Cancer  Check your skin from head to toe regularly.  Tell your health care provider about any new moles or changes in moles, especially if there is a change in a mole's shape or color.  Also tell your health care provider if you have a mole that is larger than the size of a pencil eraser.  Always use sunscreen. Apply sunscreen liberally and repeatedly throughout the day.  Protect yourself by wearing long sleeves, pants, a wide-brimmed hat, and sunglasses whenever you are outside.  Heart disease, diabetes, and high blood pressure  High blood pressure causes heart disease and increases the risk of stroke. High blood pressure is more   likely to develop in: ? People who have blood pressure in the high end of the normal range (130-139/85-89 mm Hg). ? People who are overweight or obese. ? People who are African American.  If you are 90-95 years of age, have your blood pressure checked every 3-5 years. If you are 4 years of age or older, have your blood pressure checked every year. You should have your blood pressure measured twice-once when you are at a hospital or clinic, and once when you are not at a hospital or clinic. Record the average of the two measurements. To check your blood pressure when you are not at a hospital or clinic, you can use: ? An automated blood pressure machine at a pharmacy. ? A home blood pressure monitor.  If you are between 44 years and 21 years old, ask your health care provider if you should take aspirin to prevent strokes.  Have regular diabetes screenings. This involves taking a blood sample to check your fasting blood sugar level. ? If you are  at a normal weight and have a low risk for diabetes, have this test once every three years after 54 years of age. ? If you are overweight and have a high risk for diabetes, consider being tested at a younger age or more often. Preventing infection Hepatitis B  If you have a higher risk for hepatitis B, you should be screened for this virus. You are considered at high risk for hepatitis B if: ? You were born in a country where hepatitis B is common. Ask your health care provider which countries are considered high risk. ? Your parents were born in a high-risk country, and you have not been immunized against hepatitis B (hepatitis B vaccine). ? You have HIV or AIDS. ? You use needles to inject street drugs. ? You live with someone who has hepatitis B. ? You have had sex with someone who has hepatitis B. ? You get hemodialysis treatment. ? You take certain medicines for conditions, including cancer, organ transplantation, and autoimmune conditions.  Hepatitis C  Blood testing is recommended for: ? Everyone born from [birth year] through 1963. ? Anyone with known risk factors for hepatitis C.  Sexually transmitted infections (STIs)  You should be screened for sexually transmitted infections (STIs) including gonorrhea and chlamydia if: ? You are sexually active and are younger than 54 years of age. ? You are older than 54 years of age and your health care provider tells you that you are at risk for this type of infection. ? Your sexual activity has changed since you were last screened and you are at an increased risk for chlamydia or gonorrhea. Ask your health care provider if you are at risk.  If you do not have HIV, but are at risk, it may be recommended that you take a prescription medicine daily to prevent HIV infection. This is called pre-exposure prophylaxis (PrEP). You are considered at risk if: ? You are sexually active and do not regularly use condoms or know the HIV status of your  partner(s). ? You take drugs by injection. ? You are sexually active with a partner who has HIV.  Talk with your health care provider about whether you are at high risk of being infected with HIV. If you choose to begin PrEP, you should first be tested for HIV. You should then be tested every 3 months for as long as you are taking PrEP. Pregnancy  If you are premenopausal and you may  become pregnant, ask your health care provider about preconception counseling.  If you may become pregnant, take 400 to 800 micrograms (mcg) of folic acid every day.  If you want to prevent pregnancy, talk to your health care provider about birth control (contraception). Osteoporosis and menopause  Osteoporosis is a disease in which the bones lose minerals and strength with aging. This can result in serious bone fractures. Your risk for osteoporosis can be identified using a bone density scan.  If you are 40 years of age or older, or if you are at risk for osteoporosis and fractures, ask your health care provider if you should be screened.  Ask your health care provider whether you should take a calcium or vitamin D supplement to lower your risk for osteoporosis.  Menopause may have certain physical symptoms and risks.  Hormone replacement therapy may reduce some of these symptoms and risks. Talk to your health care provider about whether hormone replacement therapy is right for you. Follow these instructions at home:  Schedule regular health, dental, and eye exams.  Stay current with your immunizations.  Do not use any tobacco products including cigarettes, chewing tobacco, or electronic cigarettes.  If you are pregnant, do not drink alcohol.  If you are breastfeeding, limit how much and how often you drink alcohol.  Limit alcohol intake to no more than 1 drink per day for nonpregnant women. One drink equals 12 ounces of beer, 5 ounces of wine, or 1 ounces of hard liquor.  Do not use street  drugs.  Do not share needles.  Ask your health care provider for help if you need support or information about quitting drugs.  Tell your health care provider if you often feel depressed.  Tell your health care provider if you have ever been abused or do not feel safe at home. This information is not intended to replace advice given to you by your health care provider. Make sure you discuss any questions you have with your health care provider. Document Released: 11/22/2010 Document Revised: 10/15/2015 Document Reviewed: 02/10/2015 Elsevier Interactive Patient Education  [employer].

## 2017-03-13 ENCOUNTER — Telehealth: Payer: Self-pay | Admitting: Internal Medicine

## 2017-03-13 MED ORDER — LEVOTHYROXINE SODIUM 25 MCG PO TABS: 25.0000 ug | ORAL_TABLET | Freq: Every day | ORAL | 1 refills | Status: DC

## 2017-03-13 NOTE — Telephone Encounter (Signed)
Pt called for a refill of her levothyroxine (SYNTHROID, LEVOTHROID) 25 MCG tablet  Please advise  POF

## 2017-03-13 NOTE — Telephone Encounter (Signed)
Reviewed chart pt is up-to-date sent refills to [facility].../lmb  

## 2017-03-29 ENCOUNTER — Encounter: Payer: Self-pay | Admitting: Podiatry

## 2017-03-29 ENCOUNTER — Ambulatory Visit: Payer: BLUE CROSS/BLUE SHIELD | Admitting: Podiatry

## 2017-03-29 ENCOUNTER — Ambulatory Visit (INDEPENDENT_AMBULATORY_CARE_PROVIDER_SITE_OTHER): Payer: BLUE CROSS/BLUE SHIELD

## 2017-03-29 VITALS — BP 123/84 | HR 84 | Resp 16

## 2017-03-29 DIAGNOSIS — M2011 Hallux valgus (acquired), right foot: Secondary | ICD-10-CM | POA: Diagnosis not present

## 2017-03-29 DIAGNOSIS — M2012 Hallux valgus (acquired), left foot: Secondary | ICD-10-CM

## 2017-03-29 NOTE — Progress Notes (Signed)
Subjective:    Patient ID: Sandra Yates, female   DOB: 56 y.o.   MRN: [id number]   HPI patient presents stating she has a very painful bunion deformity right over left and it's making it hard for her to wear shoe gear. States the right is much worse and the left is not bothering her significantly at this time    Review of Systems  All other systems reviewed and are negative.       Objective:  Physical Exam  Constitutional: She appears well-developed and well-nourished.  Cardiovascular: Intact distal pulses.  Pulmonary/Chest: Effort normal.  Musculoskeletal: Normal range of motion.  Neurological: She is alert.  Skin: Skin is warm.  Nursing note and vitals reviewed.  neurovascular status found to be intact muscle strength adequate range of motion within normal limits with patient found to have exquisite discomfort in the right first MPJ with redness around the joint surface and pain with palpation with what appears to be nerve irritation. The left is not as tender with structural deformity noted in the left. Patient found have good digital perfusion well oriented 3     Assessment:   Significant structural bunion deformity right over left with symptomatic inflammation pain in the right      Plan:   H&P condition reviewed at great length. Due to long-standing several year history and failure to respond to conservative wider shoes soaks padding I've recommended distal osteotomy explaining we may not get complete correction but I do think it'll put it in a much better position and give her good long-term reduction of her discomfort. Patient wants surgery and is tenably scheduled for 3 weeks and will reappoint for consult in the next week to go over in greater detail. All instructions given to patient  X-rays indicate there is significant elevation of the intermetatarsal angle left over right with the right being more symptomatic

## 2017-03-29 NOTE — Patient Instructions (Signed)

## 2017-03-29 NOTE — Progress Notes (Signed)
   Subjective:    Patient ID: Sandra Yates, female    DOB: 08/28/1960, 56 y.o.   MRN: [id number]  HPI    Review of Systems  All other systems reviewed and are negative.      Objective:   Physical Exam        Assessment & Plan:

## 2017-03-29 NOTE — Progress Notes (Signed)
Dg  

## 2017-04-06 ENCOUNTER — Encounter: Payer: Self-pay | Admitting: Podiatry

## 2017-04-06 ENCOUNTER — Ambulatory Visit (INDEPENDENT_AMBULATORY_CARE_PROVIDER_SITE_OTHER): Payer: BLUE CROSS/BLUE SHIELD | Admitting: Podiatry

## 2017-04-06 DIAGNOSIS — M2012 Hallux valgus (acquired), left foot: Secondary | ICD-10-CM | POA: Diagnosis not present

## 2017-04-06 DIAGNOSIS — M2011 Hallux valgus (acquired), right foot: Secondary | ICD-10-CM | POA: Diagnosis not present

## 2017-04-06 NOTE — Progress Notes (Signed)
Subjective:    Patient ID: Sandra Yates, female   DOB: 56 y.o.   MRN: [id number]   HPI patient presents stating that she's excited to get this right bunion fixed and would like to get the left one done as soon as possible after the first one. 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 Patient wants surgery understands complications signs consent form and at this time I dispensed air fracture walker with instructions on usage. I instructed that total recovery can take 6 months to one year with no long-term guarantees patient stands wants surgery and is encouraged to call with questions prior to procedure

## 2017-04-06 NOTE — Patient Instructions (Signed)
Pre-Operative Instructions  Congratulations, you have decided to take an important step towards improving your quality of life.  You can be assured that the doctors and staff at [facility] will be with you every step of the way.  Here are some important things you should know:  1. Plan to be at the surgery center/hospital at least 1 (one) hour prior to your scheduled time, unless otherwise directed by the surgical center/hospital staff.  You must have a responsible adult accompany you, remain during the surgery and drive you home.  Make sure you have directions to the surgical center/hospital to ensure you arrive on time. 2. If you are having surgery at [facility] or [facility], you will need a copy of your medical history and physical form from your family physician within one month prior to the date of surgery. We will give you a form for your primary physician to complete.  3. We make every effort to accommodate the date you request for surgery.  However, there are times where surgery dates or times have to be moved.  We will contact you as soon as possible if a change in schedule is required.   4. No aspirin/ibuprofen for one week before surgery.  If you are on aspirin, any non-steroidal anti-inflammatory medications (Mobic, Aleve, Ibuprofen) should not be taken seven (7) days prior to your surgery.  You make take Tylenol for pain prior to surgery.  5. Medications - If you are taking daily heart and blood pressure medications, seizure, reflux, allergy, asthma, anxiety, pain or diabetes medications, make sure you notify the surgery center/hospital before the day of surgery so they can tell you which medications you should take or avoid the day of surgery. 6. No food or drink after midnight the night before surgery unless directed otherwise by surgical center/hospital staff. 7. No alcoholic beverages 24-hours prior to surgery.  No smoking 24-hours prior or 24-hours after  surgery. 8. Wear loose pants or shorts. They should be loose enough to fit over bandages, boots, and casts. 9. Don't wear slip-on shoes. Sneakers are preferred. 10. Bring your boot with you to the surgery center/hospital.  Also bring crutches or a walker if your physician has prescribed it for you.  If you do not have this equipment, it will be provided for you after surgery. 11. If you have not been contacted by the surgery center/hospital by the day before your surgery, call to confirm the date and time of your surgery. 12. Leave-time from work may vary depending on the type of surgery you have.  Appropriate arrangements should be made prior to surgery with your employer. 13. Prescriptions will be provided immediately following surgery by your doctor.  Fill these as soon as possible after surgery and take the medication as directed. Pain medications will not be refilled on weekends and must be approved by the doctor. 14. Remove nail polish on the operative foot and avoid getting pedicures prior to surgery. 15. Wash the night before surgery.  The night before surgery wash the foot and leg well with water and the antibacterial soap provided. Be sure to pay special attention to beneath the toenails and in between the toes.  Wash for at least three (3) minutes. Rinse thoroughly with water and dry well with a towel.  Perform this wash unless told not to do so by your physician.  Enclosed: 1 Ice pack (please put in freezer the night before surgery)   1 Hibiclens skin cleaner     Pre-op instructions  If you have any questions regarding the instructions, please do not hesitate to call our office.  [place]: [address], [place], [place] [address] -- [phone]  [place]: [address] [place], [place] [address] -- [phone]  [place]: [address]  [place], [place] [address] -- [phone]  [place]: [address], [address], [place], [place] [address] -- [phone]  Website:  [email or web address] 

## 2017-04-17 ENCOUNTER — Telehealth: Payer: Self-pay | Admitting: Podiatry

## 2017-04-17 NOTE — Telephone Encounter (Signed)
I'm scheduled to have surgery tomorrow morning by Dr. Paulla Dolly. I wanted to know if it was okay to take tylenol around 3:00 or 4:00 am before I arrive to the surgical center? Please give me a call back at [phone]. Thank you.

## 2017-04-18 ENCOUNTER — Telehealth: Payer: Self-pay | Admitting: Podiatry

## 2017-04-18 ENCOUNTER — Encounter: Payer: Self-pay | Admitting: Podiatry

## 2017-04-18 DIAGNOSIS — M2011 Hallux valgus (acquired), right foot: Secondary | ICD-10-CM | POA: Diagnosis not present

## 2017-04-18 NOTE — Telephone Encounter (Signed)
This is April, Pharmacist with [facility] calling about the percocet and Zofran prescription that was written today. The percocet I needed to touch base and see if I can change the directions for a little bit. It was written for I tablet by mouth every four hours quantity 25. Per the new opioid policy I can change it for 1 every 6 days which is 3 per day and since she had surgery I can fill a seven day supply which would end up giving her 21 pills. I've already spoken to the pt about this and she was okay with it, I just need to get your approval. You can call us back at [phone]. Thank you.

## 2017-04-18 NOTE — Telephone Encounter (Signed)
I spoke with April [facility] and she states the Percocet 10/325 is over their recommended 84mEq of morephine and recommended #21 one tablet every 8 hours prn pain. Dr. Josephina Shih the change in the order.

## 2017-04-19 ENCOUNTER — Telehealth: Payer: Self-pay | Admitting: Podiatry

## 2017-04-19 MED ORDER — IBUPROFEN 800 MG PO TABS: 800.0000 mg | ORAL_TABLET | Freq: Three times a day (TID) | ORAL | 2 refills | Status: DC | PRN

## 2017-04-19 NOTE — Addendum Note (Signed)
Addended by: Harriett Sine D on: 04/19/2017 04:02 PM   Modules accepted: Orders

## 2017-04-19 NOTE — Telephone Encounter (Signed)
I had surgery yesterday by Dr. Paulla Dolly. I need to speak to someone because I'm having a problem with the medication. I wanted to ask if I can switch medications for pain relief. I can be reached at [phone]. Thank you very much.

## 2017-04-19 NOTE — Telephone Encounter (Signed)
I spoke with pt she states the pain medication is making nauseous, and pain level is 5 or 6 and would like to know if she can take "a heavy duty Ibuprofen". Dr. Paulla Dolly ordered Ibuprofen 800mg  #90 one tablet tid for pain +2refills.

## 2017-04-26 ENCOUNTER — Encounter: Payer: Self-pay | Admitting: Podiatry

## 2017-04-26 ENCOUNTER — Ambulatory Visit (INDEPENDENT_AMBULATORY_CARE_PROVIDER_SITE_OTHER): Payer: BLUE CROSS/BLUE SHIELD

## 2017-04-26 ENCOUNTER — Ambulatory Visit (INDEPENDENT_AMBULATORY_CARE_PROVIDER_SITE_OTHER): Payer: BLUE CROSS/BLUE SHIELD | Admitting: Podiatry

## 2017-04-26 DIAGNOSIS — M2012 Hallux valgus (acquired), left foot: Secondary | ICD-10-CM

## 2017-04-26 DIAGNOSIS — M2011 Hallux valgus (acquired), right foot: Secondary | ICD-10-CM | POA: Diagnosis not present

## 2017-04-26 NOTE — Progress Notes (Signed)
Subjective:   Patient ID: Sandra Yates, female   DOB: 56 y.o.   MRN: [id number]   HPI Patient presents stating I am doing really well with my surgery and I want to get my second foot but most likely will wait until February to get it fixed   ROS      Objective:  Physical Exam  Patient presents with a well-healed surgical site right first metatarsal with wound edges well coapted good alignment and no indications of pathology with negative Homans sign also noted     Assessment:  Doing well post osteotomy first metatarsal right     Plan:  H&P x-rays reviewed and at this time I recommend continued immobilization elevation compression and reappoint 2 weeks or earlier if needed x-rays indicate that there is good healing of the osteotomy with the positional component and reduction of the intermetatarsal angle

## 2017-05-10 ENCOUNTER — Ambulatory Visit (INDEPENDENT_AMBULATORY_CARE_PROVIDER_SITE_OTHER): Payer: BLUE CROSS/BLUE SHIELD | Admitting: Podiatry

## 2017-05-10 ENCOUNTER — Encounter: Payer: Self-pay | Admitting: Podiatry

## 2017-05-10 ENCOUNTER — Ambulatory Visit (INDEPENDENT_AMBULATORY_CARE_PROVIDER_SITE_OTHER): Payer: BLUE CROSS/BLUE SHIELD

## 2017-05-10 DIAGNOSIS — M2012 Hallux valgus (acquired), left foot: Secondary | ICD-10-CM

## 2017-05-10 DIAGNOSIS — M2011 Hallux valgus (acquired), right foot: Secondary | ICD-10-CM

## 2017-05-10 NOTE — Progress Notes (Signed)
Subjective:   Patient ID: Sandra Yates, female   DOB: 56 y.o.   MRN: [id number]   HPI Patient states doing really well with the foot and ready to get the other one fixed in February   ROS      Objective:  Physical Exam  Neurovascular status intact with patient's right foot healing well with wound edges well coapted slight restriction of motion of the first MPJ but overall good alignment noted and structural deformity noted on the left     Assessment:  Doing well post osteotomy right with mild limitation of motion with patient admitted she has not been bending the toe likely had asked with surgical deformity left     Plan:  H&P conditions reviewed and for the right instructed on range of motion exercises and reviewed x-rays.  Left foot will be discussed at next visit  X-rays indicate the osteotomy is healing well with fixation in place joint congruence and good alignment noted

## 2017-05-31 ENCOUNTER — Ambulatory Visit (INDEPENDENT_AMBULATORY_CARE_PROVIDER_SITE_OTHER): Payer: BLUE CROSS/BLUE SHIELD | Admitting: Podiatry

## 2017-05-31 ENCOUNTER — Ambulatory Visit (INDEPENDENT_AMBULATORY_CARE_PROVIDER_SITE_OTHER): Payer: BLUE CROSS/BLUE SHIELD

## 2017-05-31 ENCOUNTER — Encounter: Payer: Self-pay | Admitting: Podiatry

## 2017-05-31 DIAGNOSIS — M2011 Hallux valgus (acquired), right foot: Secondary | ICD-10-CM

## 2017-05-31 DIAGNOSIS — M2012 Hallux valgus (acquired), left foot: Secondary | ICD-10-CM | POA: Diagnosis not present

## 2017-05-31 NOTE — Progress Notes (Signed)
Subjective:   Patient ID: Sandra Yates, female   DOB: 57 y.o.   MRN: [id number]   HPI Patient states doing very well overall and states physical therapy has been of good health and she has good range of motion   ROS      Objective:  Physical Exam  Neurovascular status intact with patient's right foot doing very well with wound edges well coapted hallux in rectus position with no crepitus of the joint noted     Assessment:  Doing well post osteotomy first metatarsal right     Plan:  H&P condition reviewed recommend continue range of motion exercises and gradual return to normal shoe gear.  X-rays indicate osteotomy is healing very well and I did discuss her second foot today and she is going to do it in the spring and will call to schedule

## 2017-06-23 NOTE — Progress Notes (Signed)
DOS 04/18/17  [facility] bunionectomy Rt foot

## 2017-08-31 ENCOUNTER — Ambulatory Visit (INDEPENDENT_AMBULATORY_CARE_PROVIDER_SITE_OTHER): Payer: BLUE CROSS/BLUE SHIELD

## 2017-08-31 ENCOUNTER — Encounter: Payer: Self-pay | Admitting: Podiatry

## 2017-08-31 ENCOUNTER — Ambulatory Visit: Payer: BLUE CROSS/BLUE SHIELD | Admitting: Podiatry

## 2017-08-31 DIAGNOSIS — M7750 Other enthesopathy of unspecified foot: Secondary | ICD-10-CM | POA: Diagnosis not present

## 2017-08-31 DIAGNOSIS — M2011 Hallux valgus (acquired), right foot: Secondary | ICD-10-CM

## 2017-08-31 DIAGNOSIS — M779 Enthesopathy, unspecified: Secondary | ICD-10-CM

## 2017-08-31 MED ORDER — METHYLPREDNISOLONE 4 MG PO TBPK: ORAL_TABLET | ORAL | 0 refills | Status: DC

## 2017-08-31 MED ORDER — TRIAMCINOLONE ACETONIDE 10 MG/ML IJ SUSP
10.0000 mg | Freq: Once | INTRAMUSCULAR | Status: AC
  Administered 2017-08-31: 10 mg

## 2017-09-01 ENCOUNTER — Ambulatory Visit: Payer: BLUE CROSS/BLUE SHIELD | Admitting: Podiatry

## 2017-09-01 NOTE — Progress Notes (Signed)
Subjective:   Patient ID: Sandra Yates, female   DOB: 57 y.o.   MRN: [id number]   HPI Patient states she was doing really well with surgery and around a month ago she started to develop pain on the inside of the foot and she does not remember any kind of injury.  States it is very tender   ROS      Objective:  Physical Exam  Neurovascular status is intact with incision site healing well with wound edges well coapted no drainage or other activity.  Patient has excellent range of motion with no pain upon motion and has inflammation of the medial side of the right first metatarsal that is localized with no erythema or other pathology associated with it.  It is very tender when palpated     Assessment:  Inflammatory condition which may be related to subtle injury or fluid accumulation with no indication currently of infection or other process with also possibility of metal allergy     Plan:  H&P x-rays reviewed.  At this point I did a proximal nerve block and using sterile technique I did carefully inject the area with 3 mg dexamethasone Kenalog 5 mg Xylocaine and advised on heat ice therapy and placed on 6-day Medrol Dosepak.  Reappoint in the next 2-3 weeks or earlier if needed and we may need to consider fixation removal or other modalities if symptoms do not improve  X-rays indicate that the fixation is in place the osteotomy has healed well with good alignment of the joint surface

## 2017-09-04 ENCOUNTER — Telehealth: Payer: Self-pay | Admitting: Podiatry

## 2017-09-04 NOTE — Telephone Encounter (Signed)
I'm a pt of Dr. Mellody Drown and I need to speak to the nurse about medication he started me on last week. It's making me very sick and I was wondering if I can change medications. I can be reached at [phone]. Thank you.

## 2017-09-04 NOTE — Telephone Encounter (Addendum)
Pt states the medrol dose pack makes her sick she wanted to know if we could order the prednisone that comes as a large white pill that she took for over a month for her swollen foot, without getting sick. I told pt that I would inform Dr. Paulla Dolly and call again, but he may want to put her on an antiinflammatory medication.

## 2017-09-07 ENCOUNTER — Telehealth: Payer: Self-pay | Admitting: Podiatry

## 2017-09-07 MED ORDER — MELOXICAM 15 MG PO TABS: 15.0000 mg | ORAL_TABLET | Freq: Every day | ORAL | 0 refills | Status: DC

## 2017-09-07 NOTE — Telephone Encounter (Signed)
I'm a pt of Dr. Mellody Drown. I called about my medication on Monday and someone said they would call me back. I didn't get a call back so this is my second call. Its in regards to possibly changing my medicine. You can reach me at [phone]. Thank you.

## 2017-09-07 NOTE — Addendum Note (Signed)
Addended by: Harriett Sine D on: 09/07/2017 11:18 AM   Modules accepted: Orders

## 2017-09-07 NOTE — Telephone Encounter (Signed)
Pt states she is still having discomfort and swelling. Dr. Paulla Dolly ordered Mobic 15mg  #30 one tablet po daily. I informed pt.

## 2017-09-13 NOTE — Telephone Encounter (Signed)
Check on her. Shouldn't need more meds

## 2017-09-14 ENCOUNTER — Ambulatory Visit: Payer: BLUE CROSS/BLUE SHIELD | Admitting: Podiatry

## 2017-09-22 ENCOUNTER — Ambulatory Visit: Payer: BLUE CROSS/BLUE SHIELD | Admitting: Podiatry

## 2017-09-22 ENCOUNTER — Encounter: Payer: Self-pay | Admitting: Podiatry

## 2017-09-22 DIAGNOSIS — M2011 Hallux valgus (acquired), right foot: Secondary | ICD-10-CM

## 2017-09-22 DIAGNOSIS — M779 Enthesopathy, unspecified: Secondary | ICD-10-CM | POA: Diagnosis not present

## 2017-09-25 NOTE — Progress Notes (Signed)
Subjective:   Patient ID: Sandra Yates, female   DOB: 57 y.o.   MRN: [id number]   HPI Patient presents stating overall doing well with the foot with minimal discomfort and able to walk   ROS      Objective:  Physical Exam  Neurovascular status intact with foot and excellent alignment with the area of inflammation that she was experiencing this seems to have done very well     Assessment:  Tendinitis right foot that improved with excellent structural bunion correction     Plan:  Discharge patient will be seen back on an as-needed basis

## 2017-10-04 ENCOUNTER — Other Ambulatory Visit: Payer: Self-pay | Admitting: Internal Medicine

## 2018-03-26 ENCOUNTER — Other Ambulatory Visit: Payer: Self-pay

## 2018-03-26 ENCOUNTER — Ambulatory Visit: Payer: Self-pay | Admitting: *Deleted

## 2018-03-26 ENCOUNTER — Emergency Department (HOSPITAL_COMMUNITY): Payer: BLUE CROSS/BLUE SHIELD

## 2018-03-26 ENCOUNTER — Emergency Department (HOSPITAL_COMMUNITY)
Admission: EM | Admit: 2018-03-26 | Discharge: 2018-03-26 | Disposition: A | Discharge status: Home / Self Care | Payer: BLUE CROSS/BLUE SHIELD | Attending: Emergency Medicine | Admitting: Emergency Medicine

## 2018-03-26 ENCOUNTER — Encounter (HOSPITAL_COMMUNITY): Payer: Self-pay | Admitting: Emergency Medicine

## 2018-03-26 DIAGNOSIS — Z79899 Other long term (current) drug therapy: Secondary | ICD-10-CM | POA: Diagnosis not present

## 2018-03-26 DIAGNOSIS — E039 Hypothyroidism, unspecified: Secondary | ICD-10-CM | POA: Diagnosis not present

## 2018-03-26 DIAGNOSIS — R1013 Epigastric pain: Secondary | ICD-10-CM | POA: Diagnosis present

## 2018-03-26 DIAGNOSIS — K2100 Gastro-esophageal reflux disease with esophagitis, without bleeding: Secondary | ICD-10-CM

## 2018-03-26 DIAGNOSIS — D649 Anemia, unspecified: Secondary | ICD-10-CM | POA: Diagnosis not present

## 2018-03-26 LAB — COMPREHENSIVE METABOLIC PANEL
ALK PHOS: 81 U/L (ref 38–126)
ALT: 73 U/L — ABNORMAL HIGH (ref 0–44)
AST: 40 U/L (ref 15–41)
Albumin: 4.3 g/dL (ref 3.5–5.0)
Anion gap: 10 (ref 5–15)
BILIRUBIN TOTAL: 1.4 mg/dL — AB (ref 0.3–1.2)
BUN: 12 mg/dL (ref 6–20)
CALCIUM: 9.6 mg/dL (ref 8.9–10.3)
CO2: 28 mmol/L (ref 22–32)
Chloride: 100 mmol/L (ref 98–111)
Creatinine, Ser: 0.86 mg/dL (ref 0.44–1.00)
GLUCOSE: 87 mg/dL (ref 70–99)
POTASSIUM: 3.5 mmol/L (ref 3.5–5.1)
Sodium: 138 mmol/L (ref 135–145)
TOTAL PROTEIN: 9.2 g/dL — AB (ref 6.5–8.1)

## 2018-03-26 LAB — CBC
HEMATOCRIT: 42.8 % (ref 36.0–46.0)
Hemoglobin: 13.7 g/dL (ref 12.0–15.0)
MCH: 29.7 pg (ref 26.0–34.0)
MCHC: 32 g/dL (ref 30.0–36.0)
MCV: 92.8 fL (ref 80.0–100.0)
NRBC: 0 % (ref 0.0–0.2)
Platelets: 169 10*3/uL (ref 150–400)
RBC: 4.61 MIL/uL (ref 3.87–5.11)
RDW: 14.6 % (ref 11.5–15.5)
WBC: 6 10*3/uL (ref 4.0–10.5)

## 2018-03-26 LAB — I-STAT BETA HCG BLOOD, ED (MC, WL, AP ONLY)

## 2018-03-26 LAB — I-STAT TROPONIN, ED: TROPONIN I, POC: 0 ng/mL (ref 0.00–0.08)

## 2018-03-26 LAB — LIPASE, BLOOD: LIPASE: 36 U/L (ref 11–51)

## 2018-03-26 MED ORDER — FAMOTIDINE 20 MG PO TABS
40.0000 mg | ORAL_TABLET | Freq: Once | ORAL | Status: AC
  Administered 2018-03-26: 40 mg via ORAL
  Filled 2018-03-26: qty 2

## 2018-03-26 MED ORDER — LIDOCAINE VISCOUS HCL 2 % MT SOLN
15.0000 mL | Freq: Once | OROMUCOSAL | Status: AC
  Administered 2018-03-26: 15 mL via ORAL
  Filled 2018-03-26: qty 15

## 2018-03-26 MED ORDER — SUCRALFATE 1 G PO TABS: 1.0000 g | ORAL_TABLET | Freq: Four times a day (QID) | ORAL | 0 refills | Status: DC

## 2018-03-26 MED ORDER — PANTOPRAZOLE SODIUM 20 MG PO TBEC: 20.0000 mg | DELAYED_RELEASE_TABLET | Freq: Two times a day (BID) | ORAL | 0 refills | Status: DC

## 2018-03-26 MED ORDER — ALUM & MAG HYDROXIDE-SIMETH 200-200-20 MG/5ML PO SUSP
30.0000 mL | Freq: Once | ORAL | Status: DC
  Filled 2018-03-26: qty 30

## 2018-03-26 MED ORDER — ALUM & MAG HYDROXIDE-SIMETH 200-200-20 MG/5ML PO SUSP
30.0000 mL | Freq: Once | ORAL | Status: AC
  Administered 2018-03-26: 30 mL via ORAL

## 2018-03-26 MED ORDER — SUCRALFATE 1 G PO TABS
1.0000 g | ORAL_TABLET | Freq: Once | ORAL | Status: AC
  Administered 2018-03-26: 1 g via ORAL
  Filled 2018-03-26: qty 1

## 2018-03-26 NOTE — ED Triage Notes (Signed)
Pt c/o central chest pains that has been going on for 5 days that will radiate to right side. Pt reports pain with cough and swallowing.

## 2018-03-26 NOTE — ED Provider Notes (Signed)
[facility] DEPT Provider Note   CSN: [id number] Arrival date & time: 03/26/18  1028     History   Chief Complaint Chief Complaint  Patient presents with  . Chest Pain    HPI Sandra Yates is a 57 y.o. female.  57 year old female presents with 5 days of epigastric discomfort which radiates into her upper chest.  No anginal qualities to this.  No fever or chills.  No nausea vomiting.  States that symptoms are characterized as a burning and worse when she swallows or eats.  States that belching does relieve her symptoms.  Denies any black or tarry stools.  Did use Pepcid with some relief.  No prior history of same.     Past Medical History:  Diagnosis Date  . Allergy   . Anemia   . Bilateral swelling of feet and ankles   . Dizziness   . Fibroids   . Hypothyroidism   . Insomnia   . Irregular heart beat    prior to thyroid medication  . Thyroid disease   . Umbilical hernia 7/65/4650    Patient Active Problem List   Diagnosis Date Noted  . Anemia, iron deficiency 05/05/2015  . Hot flashes 07/24/2014  . Insomnia 01/17/2014  . Health care maintenance 10/16/2013  . Hypothyroidism 05/20/2009    Past Surgical History:  Procedure Laterality Date  . ABDOMINAL HYSTERECTOMY    . COLONOSCOPY    . DENTAL SURGERY    . DILATION AND CURETTAGE OF UTERUS    . HERNIA REPAIR    . HYSTERECTOMY ABDOMINAL WITH SALPINGECTOMY N/A 11/18/2016   Procedure: HYSTERECTOMY ABDOMINAL WITH BILATERAL SALPINGECTOMY AND OOPHORECTOMY;  Surgeon: Azucena Fallen, MD;  Location: [facility];  Service: Gynecology;  Laterality: N/A;  Dr. Dalbert Batman is going first  . radioactive iodine treatment for thyroid    . UMBILICAL HERNIA REPAIR N/A 11/18/2016   Procedure: HERNIA REPAIR UMBILICAL ADULT;  Surgeon: Fanny Skates, MD;  Location: [facility];  Service: General;  Laterality: N/A;     OB History   None      Home Medications    Prior to Admission medications   Medication Sig  Start Date End Date Taking? Authorizing Provider  acetaminophen (TYLENOL) 500 MG tablet Take 1,000 mg by mouth every 8 (eight) hours as needed for mild pain or headache.    [provider]  Biotin 10000 MCG TABS Take 10,000 mcg by mouth daily.    [provider]  DiphenhydrAMINE HCl 50 MG/50ML LIQD Take 50 mg by mouth at bedtime as needed (sleep).    [provider]  DM-Doxylamine-Acetaminophen 15-6.25-325 MG/15ML LIQD Take 15 mLs by mouth at bedtime as needed (sleep).    [provider]  estradiol (CLIMARA - DOSED IN MG/24 HR) 0.025 mg/24hr patch  08/31/17   [provider]  ibuprofen (ADVIL,MOTRIN) 200 MG tablet Take 400 mg by mouth every 6 (six) hours as needed for moderate pain.    [provider]  ibuprofen (ADVIL,MOTRIN) 800 MG tablet Take 1 tablet (800 mg total) by mouth every 8 (eight) hours as needed. Patient not taking: Reported on 03/26/2018 04/19/17   Wallene Huh, DPM  Ichthammol 20 % OINT Apply 1 application topically daily as needed (itching). Reported on 07/08/2015    [provider]  levothyroxine (SYNTHROID, LEVOTHROID) 25 MCG tablet TAKE 1 TABLET BY MOUTH ONCE DAILY BEFORE BREAKFAST Patient taking differently: Take 25 mcg by mouth daily before breakfast.  10/04/17   [name]  A, MD  meloxicam (MOBIC) 15 MG tablet Take 1 tablet (15 mg total) by mouth daily. 09/07/17   Wallene Huh, DPM  methylPREDNISolone (MEDROL DOSEPAK) 4 MG TBPK tablet follow package directions 08/31/17   Wallene Huh, DPM  Multiple Vitamins-Minerals (WOMENS MULTI VITAMIN & MINERAL PO) Take 2 tablets by mouth daily.     [provider]    Family History Family History  Problem Relation Age of Onset  . Cancer Mother        lung primary/brain  . Diabetes Mother   . Cancer Father        ? lung  . Asthma Brother   . Diabetes Brother   . Diabetes Brother   . Kidney disease Brother   . Colon cancer Neg Hx     Social  History Social History   Tobacco Use  . Smoking status: Never Smoker  . Smokeless tobacco: Never Used  Substance Use Topics  . Alcohol use: Yes    Alcohol/week: 0.0 standard drinks    Comment: rare  . Drug use: No     Allergies   Patient has no known allergies.   Review of Systems Review of Systems  All other systems reviewed and are negative.    Physical Exam Updated Vital Signs BP 125/84 (BP Location: Right Arm)   Pulse 88   Temp 97.9 F (36.6 C) (Oral)   Resp 18   Ht 1.676 m (5\' 6" )   Wt 72.1 kg   LMP 08/03/2016   SpO2 100%   BMI 25.66 kg/m   Physical Exam  Constitutional: She is oriented to person, place, and time. She appears well-developed and well-nourished.  Non-toxic appearance. No distress.  HENT:  Head: Normocephalic and atraumatic.  Eyes: Pupils are equal, round, and reactive to light. Conjunctivae, EOM and lids are normal.  Neck: Normal range of motion. Neck supple. No tracheal deviation present. No thyroid mass present.  Cardiovascular: Normal rate, regular rhythm and normal heart sounds. Exam reveals no gallop.  No murmur heard. Pulmonary/Chest: Effort normal and breath sounds normal. No stridor. No respiratory distress. She has no decreased breath sounds. She has no wheezes. She has no rhonchi. She has no rales.  Abdominal: Soft. Normal appearance and bowel sounds are normal. She exhibits no distension. There is no tenderness. There is no rebound and no CVA tenderness.  Musculoskeletal: Normal range of motion. She exhibits no edema or tenderness.  Neurological: She is alert and oriented to person, place, and time. She has normal strength. No cranial nerve deficit or sensory deficit. GCS eye subscore is 4. GCS verbal subscore is 5. GCS motor subscore is 6.  Skin: Skin is warm and dry. No abrasion and no rash noted.  Psychiatric: She has a normal mood and affect. Her speech is normal and behavior is normal.  Nursing note and vitals reviewed.    ED  Treatments / Results  Labs (all labs ordered are listed, but only abnormal results are displayed) Labs Reviewed  CBC  COMPREHENSIVE METABOLIC PANEL  LIPASE, BLOOD  I-STAT TROPONIN, ED  I-STAT BETA HCG BLOOD, ED ([facility], [facility], [facility] ONLY)    EKG EKG Interpretation  Date/Time:  Monday March 26 2018 10:36:32 EST Ventricular Rate:  93 PR Interval:    QRS Duration: 78 QT Interval:  358 QTC Calculation: 446 R Axis:   68 Text Interpretation:  Sinus rhythm Probable left atrial enlargement RSR' in V1 or V2, probably normal variant Confirmed by Lacretia Leigh ([id number]) on  03/26/2018 10:54:19 AM   Radiology No results found.  Procedures Procedures (including critical care time)  Medications Ordered in ED Medications  alum & mag hydroxide-simeth (MAALOX/MYLANTA) 200-200-20 MG/5ML suspension 30 mL (has no administration in time range)  alum & mag hydroxide-simeth (MAALOX/MYLANTA) 200-200-20 MG/5ML suspension 30 mL (has no administration in time range)    And  lidocaine (XYLOCAINE) 2 % viscous mouth solution 15 mL (has no administration in time range)  famotidine (PEPCID) tablet 40 mg (has no administration in time range)  sucralfate (CARAFATE) tablet 1 g (has no administration in time range)     Initial Impression / Assessment and Plan / ED Course  I have reviewed the triage vital signs and the nursing notes.  Pertinent labs & imaging results that were available during my care of the patient were reviewed by me and considered in my medical decision making (see chart for details).     Patient treated for reflux and esophagitis and feels better.  Labs here are reassuring.  Will place on a PPI and Carafate and return precautions given  Final Clinical Impressions(s) / ED Diagnoses   Final diagnoses:  None    ED Discharge Orders    None       Lacretia Leigh, MD 03/26/18 1209

## 2018-03-26 NOTE — Telephone Encounter (Signed)
Pt called with chest discomfort/pain which feels like "a bubble in her chest/near her heart"; she says that on Wed 03/21/18 it felt like it was in the middle of her chest; by Friday 03/23/18 it came to top of right chest; starting 03/24/18 it moved to the top of her throat and it is hard to swallow; the pt said that she took pepcid on 03/25/18 at 1600; she says that the pain has gone away but it still hurts to swallow or cough; the pt states that 03/18/18 she felt light headed and dizzy; the dizziness lasted the whole day but stopped; she also says that when burps she feels like she wants to throw up; recommendations made per nurse triage protocol; the pt verbalizes understanding, and will proceed to ED; will route to office for notification of this encounter. Reason for Disposition . Chest pain lasts > 5 minutes (Exceptions: chest pain occurring > 3 days ago and now asymptomatic; same as previously diagnosed heartburn and has accompanying sour taste in mouth)  Answer Assessment - Initial Assessment Questions 1. LOCATION: "Where does it hurt?"      Moves from upper chest to throat  2. RADIATION: "Does the pain go anywhere else?" (e.g., into neck, jaw, arms, back)  Rightt arm tingling 03/25/18 3. ONSET: "When did the chest pain begin?" (Minutes, hours or days)      03/21/18 4. PATTERN "Does the pain come and go, or has it been constant since it started?"  "Does it get worse with exertion?"     constant 5. DURATION: "How long does it last" (e.g., seconds, minutes, hours)     days 6. SEVERITY: "How bad is the pain?"  (e.g., Scale 1-10; mild, moderate, or severe)    - MILD (1-3): doesn't interfere with normal activities     - MODERATE (4-7): interferes with normal activities or awakens from sleep    - SEVERE (8-10): excruciating pain, unable to do any normal activities       Rated 10 out of 10 due to discomfort 7. CARDIAC RISK FACTORS: "Do you have any history of heart problems or risk factors for heart  disease?" (e.g., prior heart attack, angina; high blood pressure, diabetes, being overweight, high cholesterol, smoking, or strong family history of heart disease)     Family hx: mom had heart disease, brother has CHF  59. PULMONARY RISK FACTORS: "Do you have any history of lung disease?"  (e.g., blood clots in lung, asthma, emphysema, birth control pills)     no 9. CAUSE: "What do you think is causing the chest pain?"     Not sure; says that when she was walking on 03/23/18 she had a pain in her back and thought she had pulled something 10. OTHER SYMPTOMS: "Do you have any other symptoms?" (e.g., dizziness, nausea, vomiting, sweating, fever, difficulty breathing, cough)       throat pain 11. PREGNANCY: "Is there any chance you are pregnant?" "When was your last menstrual period?"       \no hysterectomy  Protocols used: CHEST PAIN-A-AH

## 2018-04-02 LAB — HM MAMMOGRAPHY

## 2018-04-06 ENCOUNTER — Other Ambulatory Visit (INDEPENDENT_AMBULATORY_CARE_PROVIDER_SITE_OTHER): Payer: BLUE CROSS/BLUE SHIELD

## 2018-04-06 ENCOUNTER — Ambulatory Visit (INDEPENDENT_AMBULATORY_CARE_PROVIDER_SITE_OTHER): Payer: BLUE CROSS/BLUE SHIELD | Admitting: Internal Medicine

## 2018-04-06 ENCOUNTER — Encounter: Payer: Self-pay | Admitting: Internal Medicine

## 2018-04-06 VITALS — BP 110/82 | HR 85 | Temp 97.7°F | Ht 66.0 in | Wt 163.0 lb

## 2018-04-06 DIAGNOSIS — Z0000 Encounter for general adult medical examination without abnormal findings: Secondary | ICD-10-CM | POA: Diagnosis not present

## 2018-04-06 DIAGNOSIS — E039 Hypothyroidism, unspecified: Secondary | ICD-10-CM

## 2018-04-06 LAB — COMPREHENSIVE METABOLIC PANEL
ALBUMIN: 4.2 g/dL (ref 3.5–5.2)
ALT: 28 U/L (ref 0–35)
AST: 24 U/L (ref 0–37)
Alkaline Phosphatase: 67 U/L (ref 39–117)
BILIRUBIN TOTAL: 0.6 mg/dL (ref 0.2–1.2)
BUN: 9 mg/dL (ref 6–23)
CALCIUM: 9.7 mg/dL (ref 8.4–10.5)
CHLORIDE: 104 meq/L (ref 96–112)
CO2: 30 meq/L (ref 19–32)
CREATININE: 0.85 mg/dL (ref 0.40–1.20)
GFR: 89.23 mL/min (ref >60.00)
Glucose, Bld: 95 mg/dL (ref 70–99)
Potassium: 3.6 mEq/L (ref 3.5–5.1)
Sodium: 140 mEq/L (ref 135–145)
Total Protein: 8 g/dL (ref 6.0–8.3)

## 2018-04-06 LAB — TSH: TSH: 12.07 u[IU]/mL — AB (ref 0.35–4.50)

## 2018-04-06 LAB — LDL CHOLESTEROL, DIRECT: Direct LDL: 161 mg/dL

## 2018-04-06 LAB — CBC
HCT: 37.6 % (ref 36.0–46.0)
HEMOGLOBIN: 13 g/dL (ref 12.0–15.0)
MCHC: 34.6 g/dL (ref 30.0–36.0)
MCV: 90.1 fl (ref 78.0–100.0)
PLATELETS: 192 10*3/uL (ref 150.0–400.0)
RBC: 4.18 Mil/uL (ref 3.87–5.11)
RDW: 15 % (ref 11.5–15.5)
WBC: 4.1 10*3/uL (ref 4.0–10.5)

## 2018-04-06 LAB — LIPID PANEL
CHOLESTEROL: 243 mg/dL — AB (ref 0–200)
HDL: 45.1 mg/dL (ref >39.00)
NONHDL: 197.5
TRIGLYCERIDES: 272 mg/dL — AB (ref 0.0–149.0)
Total CHOL/HDL Ratio: 5
VLDL: 54.4 mg/dL — ABNORMAL HIGH (ref 0.0–40.0)

## 2018-04-06 LAB — T4, FREE: Free T4: 0.88 ng/dL (ref 0.60–1.60)

## 2018-04-06 LAB — VITAMIN B12: Vitamin B-12: 1193 pg/mL — ABNORMAL HIGH (ref 211–911)

## 2018-04-06 LAB — HEMOGLOBIN A1C: Hgb A1c MFr Bld: 5.6 % (ref 4.6–6.5)

## 2018-04-06 LAB — VITAMIN D 25 HYDROXY (VIT D DEFICIENCY, FRACTURES): VITD: 34.8 ng/mL (ref 30.00–100.00)

## 2018-04-06 MED ORDER — FUROSEMIDE 20 MG PO TABS: 20.0000 mg | ORAL_TABLET | Freq: Every day | ORAL | 3 refills | Status: AC

## 2018-04-06 NOTE — Assessment & Plan Note (Signed)
Flu shot declines. Shingrix declines. Tetanus up to date. Colonoscopy up to date. Mammogram up to date, pap smear not indicated. Counseled about sun safety and mole surveillance. Counseled about the dangers of distracted driving. Given 10 year screening recommendations.

## 2018-04-06 NOTE — Progress Notes (Signed)
   Subjective:    Patient ID: Sandra Yates, female    DOB: September 07, 1960, 57 y.o.   MRN: [id number]  HPI The patient is a 57 YO female coming in for physical.   PMH, [facility], social history reviewed and updated.   Review of Systems  Constitutional: Negative.   HENT: Negative.   Eyes: Negative.   Respiratory: Negative for cough, chest tightness and shortness of breath.   Cardiovascular: Negative for chest pain, palpitations and leg swelling.  Gastrointestinal: Negative for abdominal distention, abdominal pain, constipation, diarrhea, nausea and vomiting.       GERD  Musculoskeletal: Negative.   Skin: Negative.   Neurological: Negative.   Psychiatric/Behavioral: Negative.       Objective:   Physical Exam  Constitutional: She is oriented to person, place, and time. She appears well-developed and well-nourished.  HENT:  Head: Normocephalic and atraumatic.  Eyes: EOM are normal.  Neck: Normal range of motion.  Cardiovascular: Normal rate and regular rhythm.  Pulmonary/Chest: Effort normal and breath sounds normal. No respiratory distress. She has no wheezes. She has no rales.  Abdominal: Soft. Bowel sounds are normal. She exhibits no distension. There is no tenderness. There is no rebound.  Musculoskeletal: She exhibits no edema.  Neurological: She is alert and oriented to person, place, and time. Coordination normal.  Skin: Skin is warm and dry.  Psychiatric: She has a normal mood and affect.   Vitals:   04/06/18 1412  BP: 110/82  Pulse: 85  Temp: 97.7 F (36.5 C)  TempSrc: Oral  SpO2: 95%  Weight: 163 lb (73.9 kg)  Height: 5\' 6"  (1.676 m)      Assessment & Plan:

## 2018-04-06 NOTE — Assessment & Plan Note (Signed)
Checking TSH and free T4 and adjust as needed her synthroid 25 mcg daily.

## 2018-04-06 NOTE — Patient Instructions (Addendum)
We have sent in the fluid pill lasix to take 1 pill daily as needed for fluid.    Health Maintenance, Female Adopting a healthy lifestyle and getting preventive care can go a long way to promote health and wellness. Talk with your health care provider about what schedule of regular examinations is right for you. This is a good chance for you to check in with your provider about disease prevention and staying healthy. In between checkups, there are plenty of things you can do on your own. Experts have done a lot of research about which lifestyle changes and preventive measures are most likely to keep you healthy. Ask your health care provider for more information. Weight and diet Eat a healthy diet  Be sure to include plenty of vegetables, fruits, low-fat dairy products, and lean protein.  Do not eat a lot of foods high in solid fats, added sugars, or salt.  Get regular exercise. This is one of the most important things you can do for your health. ? Most adults should exercise for at least 150 minutes each week. The exercise should increase your heart rate and make you sweat (moderate-intensity exercise). ? Most adults should also do strengthening exercises at least twice a week. This is in addition to the moderate-intensity exercise.  Maintain a healthy weight  Body mass index (BMI) is a measurement that can be used to identify possible weight problems. It estimates body fat based on height and weight. Your health care provider can help determine your BMI and help you achieve or maintain a healthy weight.  For females 87 years of age and older: ? A BMI below 18.5 is considered underweight. ? A BMI of 18.5 to 24.9 is normal. ? A BMI of 25 to 29.9 is considered overweight. ? A BMI of 30 and above is considered obese.  Watch levels of cholesterol and blood lipids  You should start having your blood tested for lipids and cholesterol at 55 years of age, then have this test every 5  years.  You may need to have your cholesterol levels checked more often if: ? Your lipid or cholesterol levels are high. ? You are older than 55 years of age. ? You are at high risk for heart disease.  Cancer screening Lung Cancer  Lung cancer screening is recommended for adults 81-16 years old who are at high risk for lung cancer because of a history of smoking.  A yearly low-dose CT scan of the lungs is recommended for people who: ? Currently smoke. ? Have quit within the past 15 years. ? Have at least a 30-pack-year history of smoking. A pack year is smoking an average of one pack of cigarettes a day for 1 year.  Yearly screening should continue until it has been 15 years since you quit.  Yearly screening should stop if you develop a health problem that would prevent you from having lung cancer treatment.  Breast Cancer  Practice breast self-awareness. This means understanding how your breasts normally appear and feel.  It also means doing regular breast self-exams. Let your health care provider know about any changes, no matter how small.  If you are in your 20s or 30s, you should have a clinical breast exam (CBE) by a health care provider every 1-3 years as part of a regular health exam.  If you are 66 or older, have a CBE every year. Also consider having a breast X-ray (mammogram) every year.  If you have a family  history of breast cancer, talk to your health care provider about genetic screening.  If you are at high risk for breast cancer, talk to your health care provider about having an MRI and a mammogram every year.  Breast cancer gene (BRCA) assessment is recommended for women who have family members with BRCA-related cancers. BRCA-related cancers include: ? Breast. ? Ovarian. ? Tubal. ? Peritoneal cancers.  Results of the assessment will determine the need for genetic counseling and BRCA1 and BRCA2 testing.  Cervical Cancer Your health care provider may  recommend that you be screened regularly for cancer of the pelvic organs (ovaries, uterus, and vagina). This screening involves a pelvic examination, including checking for microscopic changes to the surface of your cervix (Pap test). You may be encouraged to have this screening done every 3 years, beginning at age 83.  For women ages 16-65, health care providers may recommend pelvic exams and Pap testing every 3 years, or they may recommend the Pap and pelvic exam, combined with testing for human papilloma virus (HPV), every 5 years. Some types of HPV increase your risk of cervical cancer. Testing for HPV may also be done on women of any age with unclear Pap test results.  Other health care providers may not recommend any screening for nonpregnant women who are considered low risk for pelvic cancer and who do not have symptoms. Ask your health care provider if a screening pelvic exam is right for you.  If you have had past treatment for cervical cancer or a condition that could lead to cancer, you need Pap tests and screening for cancer for at least 20 years after your treatment. If Pap tests have been discontinued, your risk factors (such as having a new sexual partner) need to be reassessed to determine if screening should resume. Some women have medical problems that increase the chance of getting cervical cancer. In these cases, your health care provider may recommend more frequent screening and Pap tests.  Colorectal Cancer  This type of cancer can be detected and often prevented.  Routine colorectal cancer screening usually begins at 55 years of age and continues through 55 years of age.  Your health care provider may recommend screening at an earlier age if you have risk factors for colon cancer.  Your health care provider may also recommend using home test kits to check for hidden blood in the stool.  A small camera at the end of a tube can be used to examine your colon directly  (sigmoidoscopy or colonoscopy). This is done to check for the earliest forms of colorectal cancer.  Routine screening usually begins at age 43.  Direct examination of the colon should be repeated every 5-10 years through 55 years of age. However, you may need to be screened more often if early forms of precancerous polyps or small growths are found.  Skin Cancer  Check your skin from head to toe regularly.  Tell your health care provider about any new moles or changes in moles, especially if there is a change in a mole's shape or color.  Also tell your health care provider if you have a mole that is larger than the size of a pencil eraser.  Always use sunscreen. Apply sunscreen liberally and repeatedly throughout the day.  Protect yourself by wearing long sleeves, pants, a wide-brimmed hat, and sunglasses whenever you are outside.  Heart disease, diabetes, and high blood pressure  High blood pressure causes heart disease and increases the risk of stroke.  High blood pressure is more likely to develop in: ? People who have blood pressure in the high end of the normal range (130-139/85-89 mm Hg). ? People who are overweight or obese. ? People who are African American.  If you are 48-73 years of age, have your blood pressure checked every 3-5 years. If you are 45 years of age or older, have your blood pressure checked every year. You should have your blood pressure measured twice-once when you are at a hospital or clinic, and once when you are not at a hospital or clinic. Record the average of the two measurements. To check your blood pressure when you are not at a hospital or clinic, you can use: ? An automated blood pressure machine at a pharmacy. ? A home blood pressure monitor.  If you are between 11 years and 45 years old, ask your health care provider if you should take aspirin to prevent strokes.  Have regular diabetes screenings. This involves taking a blood sample to check your  fasting blood sugar level. ? If you are at a normal weight and have a low risk for diabetes, have this test once every three years after 55 years of age. ? If you are overweight and have a high risk for diabetes, consider being tested at a younger age or more often. Preventing infection Hepatitis B  If you have a higher risk for hepatitis B, you should be screened for this virus. You are considered at high risk for hepatitis B if: ? You were born in a country where hepatitis B is common. Ask your health care provider which countries are considered high risk. ? Your parents were born in a high-risk country, and you have not been immunized against hepatitis B (hepatitis B vaccine). ? You have HIV or AIDS. ? You use needles to inject street drugs. ? You live with someone who has hepatitis B. ? You have had sex with someone who has hepatitis B. ? You get hemodialysis treatment. ? You take certain medicines for conditions, including cancer, organ transplantation, and autoimmune conditions.  Hepatitis C  Blood testing is recommended for: ? Everyone born from [birth year] through 1963. ? Anyone with known risk factors for hepatitis C.  Sexually transmitted infections (STIs)  You should be screened for sexually transmitted infections (STIs) including gonorrhea and chlamydia if: ? You are sexually active and are younger than 55 years of age. ? You are older than 55 years of age and your health care provider tells you that you are at risk for this type of infection. ? Your sexual activity has changed since you were last screened and you are at an increased risk for chlamydia or gonorrhea. Ask your health care provider if you are at risk.  If you do not have HIV, but are at risk, it may be recommended that you take a prescription medicine daily to prevent HIV infection. This is called pre-exposure prophylaxis (PrEP). You are considered at risk if: ? You are sexually active and do not regularly use condoms  or know the HIV status of your partner(s). ? You take drugs by injection. ? You are sexually active with a partner who has HIV.  Talk with your health care provider about whether you are at high risk of being infected with HIV. If you choose to begin PrEP, you should first be tested for HIV. You should then be tested every 3 months for as long as you are taking PrEP. Pregnancy  If you  are premenopausal and you may become pregnant, ask your health care provider about preconception counseling.  If you may become pregnant, take 400 to 800 micrograms (mcg) of folic acid every day.  If you want to prevent pregnancy, talk to your health care provider about birth control (contraception). Osteoporosis and menopause  Osteoporosis is a disease in which the bones lose minerals and strength with aging. This can result in serious bone fractures. Your risk for osteoporosis can be identified using a bone density scan.  If you are 76 years of age or older, or if you are at risk for osteoporosis and fractures, ask your health care provider if you should be screened.  Ask your health care provider whether you should take a calcium or vitamin D supplement to lower your risk for osteoporosis.  Menopause may have certain physical symptoms and risks.  Hormone replacement therapy may reduce some of these symptoms and risks. Talk to your health care provider about whether hormone replacement therapy is right for you. Follow these instructions at home:  Schedule regular health, dental, and eye exams.  Stay current with your immunizations.  Do not use any tobacco products including cigarettes, chewing tobacco, or electronic cigarettes.  If you are pregnant, do not drink alcohol.  If you are breastfeeding, limit how much and how often you drink alcohol.  Limit alcohol intake to no more than 1 drink per day for nonpregnant women. One drink equals 12 ounces of beer, 5 ounces of wine, or 1 ounces of hard  liquor.  Do not use street drugs.  Do not share needles.  Ask your health care provider for help if you need support or information about quitting drugs.  Tell your health care provider if you often feel depressed.  Tell your health care provider if you have ever been abused or do not feel safe at home. This information is not intended to replace advice given to you by your health care provider. Make sure you discuss any questions you have with your health care provider. Document Released: 11/22/2010 Document Revised: 10/15/2015 Document Reviewed: 02/10/2015 Elsevier Interactive Patient Education  [employer].

## 2018-04-11 ENCOUNTER — Other Ambulatory Visit: Payer: Self-pay | Admitting: Internal Medicine

## 2018-04-11 MED ORDER — LEVOTHYROXINE SODIUM 25 MCG PO TABS: ORAL_TABLET | ORAL | 1 refills | Status: DC

## 2018-04-11 NOTE — Telephone Encounter (Signed)
Copied from [place] [id number]. Topic: Quick Communication - Rx Refill/Question >> Apr 11, 2018 10:04 AM Rayann Heman wrote: Medication: levothyroxine (SYNTHROID, LEVOTHROID) 25 MCG tablet [[id number]]   Has the patient contacted their pharmacy?no Preferred Pharmacy (with phone number or street name):[place], [place] [phone] (Phone) [phone] (Fax)   Agent: Please be advised that RX refills may take up to 3 business days. We ask that you follow-up with your pharmacy.

## 2018-05-02 ENCOUNTER — Encounter: Payer: Self-pay | Admitting: Internal Medicine

## 2018-10-22 ENCOUNTER — Telehealth: Payer: Self-pay | Admitting: Internal Medicine

## 2018-10-22 MED ORDER — LEVOTHYROXINE SODIUM 25 MCG PO TABS: ORAL_TABLET | ORAL | 1 refills | Status: DC

## 2018-10-22 NOTE — Telephone Encounter (Signed)
Medication sent.

## 2018-10-22 NOTE — Telephone Encounter (Signed)
Copied from [place] [phone]. Topic: Quick Communication - Rx Refill/Question >> Oct 22, 2018 11:22 AM Pauline Good wrote: Medication: Levothyroxine 25mg   Has the patient contacted their pharmacy? No (Agent: If no, request that the patient contact the pharmacy for the refill.) no more refills (Agent: If yes, when and what did the pharmacy advise?)  Preferred Pharmacy (with phone number or street name): [facility]  Agent: Please be advised that RX refills may take up to 3 business days. We ask that you follow-up with your pharmacy.

## 2019-05-01 ENCOUNTER — Other Ambulatory Visit: Payer: Self-pay | Admitting: Internal Medicine

## 2019-05-01 MED ORDER — LEVOTHYROXINE SODIUM 25 MCG PO TABS: 25.0000 ug | ORAL_TABLET | Freq: Every day | ORAL | 0 refills | Status: DC

## 2019-05-01 NOTE — Telephone Encounter (Signed)
Requested medication (s) are due for refill today: yes  Requested medication (s) are on the active medication list: yes Last refill: /05/2018  Future visit scheduled: yes   Notes to clinic:  No valid encounter in last 12 months   Requested Prescriptions  Pending Prescriptions Disp Refills   levothyroxine (SYNTHROID) 25 MCG tablet 90 tablet 1    Sig: TAKE 1 TABLET BY MOUTH ONCE DAILY BEFORE BREAKFAST     Endocrinology:  Hypothyroid Agents Failed - 05/01/2019 10:58 AM      Failed - TSH needs to be rechecked within 3 months after an abnormal result. Refill until TSH is due.      Failed - TSH in normal range and within 360 days    TSH  Date Value Ref Range Status  04/06/2018 12.07 (H) 0.35 - 4.50 uIU/mL Final         Failed - Valid encounter within last 12 months    Recent Outpatient Visits          1 year ago Routine general medical examination at a health care facility   [place], Elizabeth A, MD   2 years ago Routine general medical examination at a health care facility   [place], Elizabeth A, MD   3 years ago Routine general medical examination at a health care facility   [place], Elizabeth A, MD   3 years ago Hypothyroidism, unspecified hypothyroidism type   [place], Elizabeth A, MD   4 years ago Easy bruising   [place], Nelson, MD      Future Appointments            In 1 week Sharlet Salina Real Cons, MD [place], [facility]

## 2019-05-01 NOTE — Telephone Encounter (Signed)
Medication Refill - Medication: levothyroxine (SYNTHROID) 25 MCG tablet [id number]   Has the patient contacted their pharmacy? No. (Agent: If no, request that the patient contact the pharmacy for the refill.) (Agent: If yes, when and what did the pharmacy advise?)  Preferred Pharmacy (with phone number or street name):  [place], [place] [phone] (Phone)     Agent: Please be advised that RX refills may take up to 3 business days. We ask that you follow-up with your pharmacy.

## 2019-05-10 ENCOUNTER — Other Ambulatory Visit: Payer: Self-pay

## 2019-05-10 ENCOUNTER — Encounter: Payer: Self-pay | Admitting: Internal Medicine

## 2019-05-10 ENCOUNTER — Other Ambulatory Visit (INDEPENDENT_AMBULATORY_CARE_PROVIDER_SITE_OTHER): Payer: BLUE CROSS/BLUE SHIELD

## 2019-05-10 ENCOUNTER — Ambulatory Visit (INDEPENDENT_AMBULATORY_CARE_PROVIDER_SITE_OTHER): Payer: BLUE CROSS/BLUE SHIELD | Admitting: Internal Medicine

## 2019-05-10 VITALS — BP 110/80 | HR 81 | Temp 97.9°F | Ht 66.0 in | Wt 160.0 lb

## 2019-05-10 DIAGNOSIS — Z23 Encounter for immunization: Secondary | ICD-10-CM | POA: Diagnosis not present

## 2019-05-10 DIAGNOSIS — Z0000 Encounter for general adult medical examination without abnormal findings: Secondary | ICD-10-CM

## 2019-05-10 DIAGNOSIS — E890 Postprocedural hypothyroidism: Secondary | ICD-10-CM

## 2019-05-10 LAB — LIPID PANEL
Cholesterol: 253 mg/dL — ABNORMAL HIGH (ref 0–200)
HDL: 46.5 mg/dL (ref >39.00)
NonHDL: 206.34
Total CHOL/HDL Ratio: 5
Triglycerides: 223 mg/dL — ABNORMAL HIGH (ref 0.0–149.0)
VLDL: 44.6 mg/dL — ABNORMAL HIGH (ref 0.0–40.0)

## 2019-05-10 LAB — COMPREHENSIVE METABOLIC PANEL
ALT: 54 U/L — ABNORMAL HIGH (ref 0–35)
AST: 36 U/L (ref 0–37)
Albumin: 4.3 g/dL (ref 3.5–5.2)
Alkaline Phosphatase: 57 U/L (ref 39–117)
BUN: 10 mg/dL (ref 6–23)
CO2: 28 mEq/L (ref 19–32)
Calcium: 9.9 mg/dL (ref 8.4–10.5)
Chloride: 104 mEq/L (ref 96–112)
Creatinine, Ser: 0.74 mg/dL (ref 0.40–1.20)
GFR: 98.12 mL/min (ref >60.00)
Glucose, Bld: 86 mg/dL (ref 70–99)
Potassium: 4 mEq/L (ref 3.5–5.1)
Sodium: 139 mEq/L (ref 135–145)
Total Bilirubin: 0.5 mg/dL (ref 0.2–1.2)
Total Protein: 7.8 g/dL (ref 6.0–8.3)

## 2019-05-10 LAB — T4, FREE: Free T4: 0.94 ng/dL (ref 0.60–1.60)

## 2019-05-10 LAB — CBC
HCT: 39.3 % (ref 36.0–46.0)
Hemoglobin: 13.5 g/dL (ref 12.0–15.0)
MCHC: 34.5 g/dL (ref 30.0–36.0)
MCV: 93.7 fl (ref 78.0–100.0)
Platelets: 177 10*3/uL (ref 150.0–400.0)
RBC: 4.19 Mil/uL (ref 3.87–5.11)
RDW: 13.5 % (ref 11.5–15.5)
WBC: 4.2 10*3/uL (ref 4.0–10.5)

## 2019-05-10 LAB — LDL CHOLESTEROL, DIRECT: Direct LDL: 136 mg/dL

## 2019-05-10 LAB — TSH: TSH: 7.4 u[IU]/mL — ABNORMAL HIGH (ref 0.35–4.50)

## 2019-05-10 NOTE — Assessment & Plan Note (Signed)
Checking TSH and free T4 and adjust synthroid 25 mcg daily as needed.  ?

## 2019-05-10 NOTE — Assessment & Plan Note (Signed)
Flu shot given. Shingrix declines today. Tetanus declines today. Colonoscopy up to date. Mammogram up to date, pap smear up to date. Counseled about sun safety and mole surveillance. Counseled about the dangers of distracted driving. Given 10 year screening recommendations.

## 2019-05-10 NOTE — Progress Notes (Signed)
   Subjective:   Patient ID: Sandra Yates, female    DOB: [birth year], 58 y.o.   MRN: [id number]  HPI The patient is a 58 YO female coming in for physical.   PMH, [place], social history reviewed and updated  Review of Systems  Constitutional: Negative.   HENT: Negative.   Eyes: Negative.   Respiratory: Negative for cough, chest tightness and shortness of breath.   Cardiovascular: Negative for chest pain, palpitations and leg swelling.  Gastrointestinal: Negative for abdominal distention, abdominal pain, constipation, diarrhea, nausea and vomiting.  Musculoskeletal: Negative.   Skin: Negative.   Neurological: Negative.   Psychiatric/Behavioral: Negative.     Objective:  Physical Exam Constitutional:      Appearance: She is well-developed.  HENT:     Head: Normocephalic and atraumatic.  Cardiovascular:     Rate and Rhythm: Normal rate and regular rhythm.  Pulmonary:     Effort: Pulmonary effort is normal. No respiratory distress.     Breath sounds: Normal breath sounds. No wheezing or rales.  Abdominal:     General: Bowel sounds are normal. There is no distension.     Palpations: Abdomen is soft.     Tenderness: There is no abdominal tenderness. There is no rebound.  Musculoskeletal:     Cervical back: Normal range of motion.  Skin:    General: Skin is warm and dry.  Neurological:     Mental Status: She is alert and oriented to person, place, and time.     Coordination: Coordination normal.     Vitals:   05/10/19 1015  BP: 110/80  Pulse: 81  Temp: 97.9 F (36.6 C)  TempSrc: Oral  SpO2: 97%  Weight: 160 lb (72.6 kg)  Height: 5\' 6"  (1.676 m)    This visit occurred during the SARS-CoV-2 public health emergency.  Safety protocols were in place, including screening questions prior to the visit, additional usage of staff PPE, and extensive cleaning of exam room while observing appropriate contact time as indicated for disinfecting solutions.   Assessment & Plan:   Flu shot given at visit

## 2019-05-10 NOTE — Patient Instructions (Signed)
Health Maintenance, Female Adopting a healthy lifestyle and getting preventive care are important in promoting health and wellness. Ask your health care provider about:  The right schedule for you to have regular tests and exams.  Things you can do on your own to prevent diseases and keep yourself healthy. What should I know about diet, weight, and exercise? Eat a healthy diet   Eat a diet that includes plenty of vegetables, fruits, low-fat dairy products, and lean protein.  Do not eat a lot of foods that are high in solid fats, added sugars, or sodium. Maintain a healthy weight Body mass index (BMI) is used to identify weight problems. It estimates body fat based on height and weight. Your health care provider can help determine your BMI and help you achieve or maintain a healthy weight. Get regular exercise Get regular exercise. This is one of the most important things you can do for your health. Most adults should:  Exercise for at least 150 minutes each week. The exercise should increase your heart rate and make you sweat (moderate-intensity exercise).  Do strengthening exercises at least twice a week. This is in addition to the moderate-intensity exercise.  Spend less time sitting. Even light physical activity can be beneficial. Watch cholesterol and blood lipids Have your blood tested for lipids and cholesterol at 56 years of age, then have this test every 5 years. Have your cholesterol levels checked more often if:  Your lipid or cholesterol levels are high.  You are older than 56 years of age.  You are at high risk for heart disease. What should I know about cancer screening? Depending on your health history and family history, you may need to have cancer screening at various ages. This may include screening for:  Breast cancer.  Cervical cancer.  Colorectal cancer.  Skin cancer.  Lung cancer. What should I know about heart disease, diabetes, and high blood  pressure? Blood pressure and heart disease  High blood pressure causes heart disease and increases the risk of stroke. This is more likely to develop in people who have high blood pressure readings, are of African descent, or are overweight.  Have your blood pressure checked: ? Every 3-5 years if you are 18-39 years of age. ? Every year if you are 40 years old or older. Diabetes Have regular diabetes screenings. This checks your fasting blood sugar level. Have the screening done:  Once every three years after age 40 if you are at a normal weight and have a low risk for diabetes.  More often and at a younger age if you are overweight or have a high risk for diabetes. What should I know about preventing infection? Hepatitis B If you have a higher risk for hepatitis B, you should be screened for this virus. Talk with your health care provider to find out if you are at risk for hepatitis B infection. Hepatitis C Testing is recommended for:  Everyone born from 1945 through 1965.  Anyone with known risk factors for hepatitis C. Sexually transmitted infections (STIs)  Get screened for STIs, including gonorrhea and chlamydia, if: ? You are sexually active and are younger than 56 years of age. ? You are older than 56 years of age and your health care provider tells you that you are at risk for this type of infection. ? Your sexual activity has changed since you were last screened, and you are at increased risk for chlamydia or gonorrhea. Ask your health care provider if   you are at risk.  Ask your health care provider about whether you are at high risk for HIV. Your health care provider may recommend a prescription medicine to help prevent HIV infection. If you choose to take medicine to prevent HIV, you should first get tested for HIV. You should then be tested every 3 months for as long as you are taking the medicine. Pregnancy  If you are about to stop having your period (premenopausal) and  you may become pregnant, seek counseling before you get pregnant.  Take 400 to 800 micrograms (mcg) of folic acid every day if you become pregnant.  Ask for birth control (contraception) if you want to prevent pregnancy. Osteoporosis and menopause Osteoporosis is a disease in which the bones lose minerals and strength with aging. This can result in bone fractures. If you are 65 years old or older, or if you are at risk for osteoporosis and fractures, ask your health care provider if you should:  Be screened for bone loss.  Take a calcium or vitamin D supplement to lower your risk of fractures.  Be given hormone replacement therapy (HRT) to treat symptoms of menopause. Follow these instructions at home: Lifestyle  Do not use any products that contain nicotine or tobacco, such as cigarettes, e-cigarettes, and chewing tobacco. If you need help quitting, ask your health care provider.  Do not use street drugs.  Do not share needles.  Ask your health care provider for help if you need support or information about quitting drugs. Alcohol use  Do not drink alcohol if: ? Your health care provider tells you not to drink. ? You are pregnant, may be pregnant, or are planning to become pregnant.  If you drink alcohol: ? Limit how much you use to 0-1 drink a day. ? Limit intake if you are breastfeeding.  Be aware of how much alcohol is in your drink. In the [place] one drink equals one 12 oz bottle of beer (355 mL), one 5 oz glass of wine (148 mL), or one 1 oz glass of hard liquor (44 mL). General instructions  Schedule regular health, dental, and eye exams.  Stay current with your vaccines.  Tell your health care provider if: ? You often feel depressed. ? You have ever been abused or do not feel safe at home. Summary  Adopting a healthy lifestyle and getting preventive care are important in promoting health and wellness.  Follow your health care provider's instructions about healthy  diet, exercising, and getting tested or screened for diseases.  Follow your health care provider's instructions on monitoring your cholesterol and blood pressure. This information is not intended to replace advice given to you by your health care provider. Make sure you discuss any questions you have with your health care provider. Document Released: 11/22/2010 Document Revised: 05/02/2018 Document Reviewed: 05/02/2018 Elsevier Patient Education  2020 Elsevier Inc.  

## 2019-06-06 ENCOUNTER — Other Ambulatory Visit: Payer: Self-pay | Admitting: Internal Medicine

## 2019-06-06 MED ORDER — LEVOTHYROXINE SODIUM 25 MCG PO TABS: 25.0000 ug | ORAL_TABLET | Freq: Every day | ORAL | 5 refills | Status: DC

## 2019-06-06 NOTE — Telephone Encounter (Signed)
Medication Refill - Medication: levothyroxine   Has the patient contacted their pharmacy? Yes.   Pt states she only has 2 pills left. Please advise.  (Agent: If no, request that the patient contact the pharmacy for the refill.) (Agent: If yes, when and what did the pharmacy advise?)  Preferred Pharmacy (with phone number or street name):  [facility] [place], [place] [address]  Phone: [phone] Fax: [phone]  Not a 24 hour pharmacy; exact hours not known.     Agent: Please be advised that RX refills may take up to 3 business days. We ask that you follow-up with your pharmacy.

## 2019-06-06 NOTE — Telephone Encounter (Signed)
Requested medication (s) are due for refill today:   Yes  Requested medication (s) are on the active medication list:   Yes  Future visit scheduled:   No.      Last ordered: 05/01/2019  #30  0 refills    30 day courtesy supply was given in Dec.    No future appts noted   Requested Prescriptions  Pending Prescriptions Disp Refills   levothyroxine (SYNTHROID) 25 MCG tablet 30 tablet 0    Sig: Take 1 tablet (25 mcg total) by mouth daily before breakfast. NEED OFFICE VISIT FOR FURTHER REFILLS      Endocrinology:  Hypothyroid Agents Failed - 06/06/2019 10:35 AM      Failed - TSH needs to be rechecked within 3 months after an abnormal result. Refill until TSH is due.      Failed - TSH in normal range and within 360 days    TSH  Date Value Ref Range Status  05/10/2019 7.40 (H) 0.35 - 4.50 uIU/mL Final          Passed - Valid encounter within last 12 months    Recent Outpatient Visits           3 weeks ago Health care maintenance   [place], Elizabeth A, MD   1 year ago Routine general medical examination at a health care facility   [place], Elizabeth A, MD   2 years ago Routine general medical examination at a health care facility   [place], Elizabeth A, MD   3 years ago Routine general medical examination at a health care facility   [place], Elizabeth A, MD   4 years ago Hypothyroidism, unspecified hypothyroidism type   [facility] -Chuck Hint, MD

## 2019-06-21 ENCOUNTER — Telehealth: Payer: Self-pay | Admitting: Internal Medicine

## 2019-06-21 MED ORDER — PANTOPRAZOLE SODIUM 40 MG PO TBEC: 40.0000 mg | DELAYED_RELEASE_TABLET | Freq: Every day | ORAL | 3 refills | Status: DC

## 2019-06-21 NOTE — Telephone Encounter (Signed)
Notified pt MD sent rx to [facility] 

## 2019-06-21 NOTE — Telephone Encounter (Signed)
Med is not on med list. pls advise if ok to send.Marland KitchenJohny Chess

## 2019-06-21 NOTE — Telephone Encounter (Signed)
Sent in for her

## 2019-06-21 NOTE — Telephone Encounter (Signed)
    Patient calling to request  RX Pantoprozole for GERD  Please advise

## 2019-08-21 ENCOUNTER — Telehealth: Payer: Self-pay

## 2019-08-21 NOTE — Telephone Encounter (Signed)
Patient called and spoke to [facility] on  08/20/2019 3:02:22 PM and states she has been experiencing chest pain. also reports irregular heartbeat. states feels like heart is pounding. has been ongoing for 2 weeks. intermittent, but more frequently now. some dizziness sometimes. worse at night  Advised to call 911 now.

## 2019-08-30 ENCOUNTER — Ambulatory Visit: Payer: BLUE CROSS/BLUE SHIELD | Attending: Internal Medicine

## 2019-08-30 DIAGNOSIS — Z23 Encounter for immunization: Secondary | ICD-10-CM

## 2019-08-30 NOTE — Progress Notes (Signed)
   Covid-19 Vaccination Clinic  Name:  Sandra Yates    MRN: [id number] DOB: 1961/04/21  08/30/2019  Sandra Yates was observed post Covid-19 immunization for 15 minutes without incident. She was provided with Vaccine Information Sheet and instruction to access the V-Safe system.   Sandra Yates was instructed to call 911 with any severe reactions post vaccine: Marland Kitchen Difficulty breathing  . Swelling of face and throat  . A fast heartbeat  . A bad rash all over body  . Dizziness and weakness   Immunizations Administered    Name Date Dose VIS Date Route   Pfizer COVID-19 Vaccine 08/30/2019  1:03 PM 0.3 mL 05/03/2019 Intramuscular   Manufacturer: [place]   Lot: [id number]   [place]: [id number]

## 2019-09-23 ENCOUNTER — Ambulatory Visit: Payer: BLUE CROSS/BLUE SHIELD | Attending: Internal Medicine

## 2019-09-23 DIAGNOSIS — Z23 Encounter for immunization: Secondary | ICD-10-CM

## 2019-09-23 NOTE — Progress Notes (Signed)
   Covid-19 Vaccination Clinic  Name:  Sandra Yates    MRN: [id number] DOB: [birth year]  09/23/2019  Sandra Yates was observed post Covid-19 immunization for 15 minutes without incident. She was provided with Vaccine Information Sheet and instruction to access the V-Safe system.   Sandra Yates was instructed to call 911 with any severe reactions post vaccine: Marland Kitchen Difficulty breathing  . Swelling of face and throat  . A fast heartbeat  . A bad rash all over body  . Dizziness and weakness   Immunizations Administered    Name Date Dose VIS Date Route   Pfizer COVID-19 Vaccine 09/23/2019 11:17 AM 0.3 mL 07/17/2018 Intramuscular   Manufacturer: [place]   Lot: [id number]   [place]: [id number]

## 2019-12-05 ENCOUNTER — Telehealth: Payer: Self-pay

## 2019-12-05 MED ORDER — LEVOTHYROXINE SODIUM 25 MCG PO TABS: 25.0000 ug | ORAL_TABLET | Freq: Every day | ORAL | 5 refills | Status: DC

## 2019-12-05 NOTE — Telephone Encounter (Signed)
1.Medication Requested:levothyroxine (SYNTHROID) 25 MCG tablet   2. Pharmacy (Name, Street, [place]):[facility] [address]  3. On Med List: Yes   4. Last Visit with PCP: 12.18.20   5. Next visit date with PCP: n/a   Agent: Please be advised that RX refills may take up to 3 business days. We ask that you follow-up with your pharmacy.

## 2020-05-12 ENCOUNTER — Encounter: Payer: Self-pay | Admitting: Internal Medicine

## 2020-05-12 ENCOUNTER — Ambulatory Visit (INDEPENDENT_AMBULATORY_CARE_PROVIDER_SITE_OTHER): Payer: 59 | Admitting: Internal Medicine

## 2020-05-12 ENCOUNTER — Other Ambulatory Visit: Payer: Self-pay

## 2020-05-12 VITALS — BP 120/82 | HR 90 | Temp 97.5°F | Ht 66.0 in | Wt 157.0 lb

## 2020-05-12 DIAGNOSIS — E890 Postprocedural hypothyroidism: Secondary | ICD-10-CM | POA: Diagnosis not present

## 2020-05-12 DIAGNOSIS — Z23 Encounter for immunization: Secondary | ICD-10-CM | POA: Diagnosis not present

## 2020-05-12 DIAGNOSIS — K219 Gastro-esophageal reflux disease without esophagitis: Secondary | ICD-10-CM

## 2020-05-12 DIAGNOSIS — Z0000 Encounter for general adult medical examination without abnormal findings: Secondary | ICD-10-CM

## 2020-05-12 LAB — COMPREHENSIVE METABOLIC PANEL
ALT: 36 U/L — ABNORMAL HIGH (ref 0–35)
AST: 25 U/L (ref 0–37)
Albumin: 4.3 g/dL (ref 3.5–5.2)
Alkaline Phosphatase: 63 U/L (ref 39–117)
BUN: 9 mg/dL (ref 6–23)
CO2: 31 mEq/L (ref 19–32)
Calcium: 10.2 mg/dL (ref 8.4–10.5)
Chloride: 103 mEq/L (ref 96–112)
Creatinine, Ser: 0.8 mg/dL (ref 0.40–1.20)
GFR: 81.85 mL/min (ref >60.00)
Glucose, Bld: 76 mg/dL (ref 70–99)
Potassium: 4.1 mEq/L (ref 3.5–5.1)
Sodium: 139 mEq/L (ref 135–145)
Total Bilirubin: 0.8 mg/dL (ref 0.2–1.2)
Total Protein: 8.1 g/dL (ref 6.0–8.3)

## 2020-05-12 LAB — CBC
HCT: 39.2 % (ref 36.0–46.0)
Hemoglobin: 13.2 g/dL (ref 12.0–15.0)
MCHC: 33.8 g/dL (ref 30.0–36.0)
MCV: 91.8 fl (ref 78.0–100.0)
Platelets: 196 10*3/uL (ref 150.0–400.0)
RBC: 4.27 Mil/uL (ref 3.87–5.11)
RDW: 13.4 % (ref 11.5–15.5)
WBC: 4.9 10*3/uL (ref 4.0–10.5)

## 2020-05-12 LAB — T4, FREE: Free T4: 0.81 ng/dL (ref 0.60–1.60)

## 2020-05-12 LAB — LIPID PANEL
Cholesterol: 279 mg/dL — ABNORMAL HIGH (ref 0–200)
HDL: 51.3 mg/dL (ref >39.00)
NonHDL: 227.21
Total CHOL/HDL Ratio: 5
Triglycerides: 245 mg/dL — ABNORMAL HIGH (ref 0.0–149.0)
VLDL: 49 mg/dL — ABNORMAL HIGH (ref 0.0–40.0)

## 2020-05-12 LAB — LDL CHOLESTEROL, DIRECT: Direct LDL: 181 mg/dL

## 2020-05-12 LAB — VITAMIN D 25 HYDROXY (VIT D DEFICIENCY, FRACTURES): VITD: 34.61 ng/mL (ref 30.00–100.00)

## 2020-05-12 LAB — VITAMIN B12: Vitamin B-12: 561 pg/mL (ref 211–911)

## 2020-05-12 LAB — HEMOGLOBIN A1C: Hgb A1c MFr Bld: 5.5 % (ref 4.6–6.5)

## 2020-05-12 LAB — TSH: TSH: 9.65 u[IU]/mL — ABNORMAL HIGH (ref 0.35–4.50)

## 2020-05-12 MED ORDER — LEVOTHYROXINE SODIUM 25 MCG PO TABS: 25.0000 ug | ORAL_TABLET | Freq: Every day | ORAL | 3 refills | Status: DC

## 2020-05-12 MED ORDER — PANTOPRAZOLE SODIUM 40 MG PO TBEC: 40.0000 mg | DELAYED_RELEASE_TABLET | Freq: Every day | ORAL | 3 refills | Status: DC

## 2020-05-12 NOTE — Assessment & Plan Note (Signed)
Refill protonix which is used intermittently.

## 2020-05-12 NOTE — Assessment & Plan Note (Signed)
Checking TSH and free T4 and adjust synthroid 25 mcg daily as needed.  ?

## 2020-05-12 NOTE — Patient Instructions (Signed)
Health Maintenance, Female Adopting a healthy lifestyle and getting preventive care are important in promoting health and wellness. Ask your health care provider about:  The right schedule for you to have regular tests and exams.  Things you can do on your own to prevent diseases and keep yourself healthy. What should I know about diet, weight, and exercise? Eat a healthy diet   Eat a diet that includes plenty of vegetables, fruits, low-fat dairy products, and lean protein.  Do not eat a lot of foods that are high in solid fats, added sugars, or sodium. Maintain a healthy weight Body mass index (BMI) is used to identify weight problems. It estimates body fat based on height and weight. Your health care provider can help determine your BMI and help you achieve or maintain a healthy weight. Get regular exercise Get regular exercise. This is one of the most important things you can do for your health. Most adults should:  Exercise for at least 150 minutes each week. The exercise should increase your heart rate and make you sweat (moderate-intensity exercise).  Do strengthening exercises at least twice a week. This is in addition to the moderate-intensity exercise.  Spend less time sitting. Even light physical activity can be beneficial. Watch cholesterol and blood lipids Have your blood tested for lipids and cholesterol at 57 years of age, then have this test every 5 years. Have your cholesterol levels checked more often if:  Your lipid or cholesterol levels are high.  You are older than 57 years of age.  You are at high risk for heart disease. What should I know about cancer screening? Depending on your health history and family history, you may need to have cancer screening at various ages. This may include screening for:  Breast cancer.  Cervical cancer.  Colorectal cancer.  Skin cancer.  Lung cancer. What should I know about heart disease, diabetes, and high blood  pressure? Blood pressure and heart disease  High blood pressure causes heart disease and increases the risk of stroke. This is more likely to develop in people who have high blood pressure readings, are of African descent, or are overweight.  Have your blood pressure checked: ? Every 3-5 years if you are 18-39 years of age. ? Every year if you are 40 years old or older. Diabetes Have regular diabetes screenings. This checks your fasting blood sugar level. Have the screening done:  Once every three years after age 40 if you are at a normal weight and have a low risk for diabetes.  More often and at a younger age if you are overweight or have a high risk for diabetes. What should I know about preventing infection? Hepatitis B If you have a higher risk for hepatitis B, you should be screened for this virus. Talk with your health care provider to find out if you are at risk for hepatitis B infection. Hepatitis C Testing is recommended for:  Everyone born from 1945 through 1965.  Anyone with known risk factors for hepatitis C. Sexually transmitted infections (STIs)  Get screened for STIs, including gonorrhea and chlamydia, if: ? You are sexually active and are younger than 57 years of age. ? You are older than 57 years of age and your health care provider tells you that you are at risk for this type of infection. ? Your sexual activity has changed since you were last screened, and you are at increased risk for chlamydia or gonorrhea. Ask your health care provider if   you are at risk.  Ask your health care provider about whether you are at high risk for HIV. Your health care provider may recommend a prescription medicine to help prevent HIV infection. If you choose to take medicine to prevent HIV, you should first get tested for HIV. You should then be tested every 3 months for as long as you are taking the medicine. Pregnancy  If you are about to stop having your period (premenopausal) and  you may become pregnant, seek counseling before you get pregnant.  Take 400 to 800 micrograms (mcg) of folic acid every day if you become pregnant.  Ask for birth control (contraception) if you want to prevent pregnancy. Osteoporosis and menopause Osteoporosis is a disease in which the bones lose minerals and strength with aging. This can result in bone fractures. If you are 65 years old or older, or if you are at risk for osteoporosis and fractures, ask your health care provider if you should:  Be screened for bone loss.  Take a calcium or vitamin D supplement to lower your risk of fractures.  Be given hormone replacement therapy (HRT) to treat symptoms of menopause. Follow these instructions at home: Lifestyle  Do not use any products that contain nicotine or tobacco, such as cigarettes, e-cigarettes, and chewing tobacco. If you need help quitting, ask your health care provider.  Do not use street drugs.  Do not share needles.  Ask your health care provider for help if you need support or information about quitting drugs. Alcohol use  Do not drink alcohol if: ? Your health care provider tells you not to drink. ? You are pregnant, may be pregnant, or are planning to become pregnant.  If you drink alcohol: ? Limit how much you use to 0-1 drink a day. ? Limit intake if you are breastfeeding.  Be aware of how much alcohol is in your drink. In the [place] one drink equals one 12 oz bottle of beer (355 mL), one 5 oz glass of wine (148 mL), or one 1 oz glass of hard liquor (44 mL). General instructions  Schedule regular health, dental, and eye exams.  Stay current with your vaccines.  Tell your health care provider if: ? You often feel depressed. ? You have ever been abused or do not feel safe at home. Summary  Adopting a healthy lifestyle and getting preventive care are important in promoting health and wellness.  Follow your health care provider's instructions about healthy  diet, exercising, and getting tested or screened for diseases.  Follow your health care provider's instructions on monitoring your cholesterol and blood pressure. This information is not intended to replace advice given to you by your health care provider. Make sure you discuss any questions you have with your health care provider. Document Revised: 05/02/2018 Document Reviewed: 05/02/2018 Elsevier Patient Education  2020 Elsevier Inc.  

## 2020-05-12 NOTE — Progress Notes (Signed)
   Subjective:   Patient ID: Sandra Yates, female    DOB: 1960/07/22, 59 y.o.   MRN: [id number]  HPI The patient is a 59 YO female coming in for physical.   PMH, [place], social history reviewed and updated  Review of Systems  Constitutional: Negative.   HENT: Negative.   Eyes: Negative.   Respiratory: Negative for cough, chest tightness and shortness of breath.   Cardiovascular: Negative for chest pain, palpitations and leg swelling.  Gastrointestinal: Negative for abdominal distention, abdominal pain, constipation, diarrhea, nausea and vomiting.  Musculoskeletal: Negative.   Skin: Negative.   Neurological: Negative.   Psychiatric/Behavioral: Negative.     Objective:  Physical Exam Constitutional:      Appearance: She is well-developed and well-nourished.  HENT:     Head: Normocephalic and atraumatic.  Eyes:     Extraocular Movements: EOM normal.  Cardiovascular:     Rate and Rhythm: Normal rate and regular rhythm.  Pulmonary:     Effort: Pulmonary effort is normal. No respiratory distress.     Breath sounds: Normal breath sounds. No wheezing or rales.  Abdominal:     General: Bowel sounds are normal. There is no distension.     Palpations: Abdomen is soft.     Tenderness: There is no abdominal tenderness. There is no rebound.  Musculoskeletal:        General: No edema.     Cervical back: Normal range of motion.  Skin:    General: Skin is warm and dry.  Neurological:     Mental Status: She is alert and oriented to person, place, and time.     Coordination: Coordination normal.  Psychiatric:        Mood and Affect: Mood and affect normal.     Vitals:   05/12/20 1301  BP: 120/82  Pulse: 90  Temp: (!) 97.5 F (36.4 C)  TempSrc: Oral  SpO2: 98%  Weight: 157 lb (71.2 kg)  Height: 5\' 6"  (1.676 m)    This visit occurred during the SARS-CoV-2 public health emergency.  Safety protocols were in place, including screening questions prior to the visit, additional  usage of staff PPE, and extensive cleaning of exam room while observing appropriate contact time as indicated for disinfecting solutions.   Assessment & Plan:  Flu shot given at visit

## 2020-05-12 NOTE — Assessment & Plan Note (Signed)
Flu shot given. Covid-19 up to date including booster. Shingrix counseled. Tetanus counseled. Colonoscopy up to date. Mammogram up to date with gyn, pap smear not indicated. Counseled about sun safety and mole surveillance. Counseled about the dangers of distracted driving. Given 10 year screening recommendations.

## 2020-05-14 ENCOUNTER — Telehealth: Payer: Self-pay | Admitting: Internal Medicine

## 2020-05-14 DIAGNOSIS — E890 Postprocedural hypothyroidism: Secondary | ICD-10-CM

## 2020-05-14 DIAGNOSIS — E785 Hyperlipidemia, unspecified: Secondary | ICD-10-CM

## 2020-05-14 MED ORDER — LEVOTHYROXINE SODIUM 50 MCG PO TABS
50.0000 ug | ORAL_TABLET | Freq: Every day | ORAL | 3 refills | Status: DC
Start: 2020-05-14 — End: 2021-04-02

## 2020-05-14 NOTE — Telephone Encounter (Signed)
Sent in new dosing 50 mcg daily. Can take 2 pills of her current dosing until gone. Labs entered for fasting in 2 months.

## 2020-05-14 NOTE — Telephone Encounter (Signed)
Patient is okay with increasing her thyroid medication and rechecking in two months. Patient states she prefers to try diet and exercise. She would like to re-evaluate her cholesterol when she gets her labs re-checked for the thyroid levels so she can see if the change in exercise and diet would help.

## 2020-05-14 NOTE — Telephone Encounter (Signed)
Pt notified of Dr Charlynne Cousins instructions on increasing medication.  Pt able to repeat instructions back & that confirms that she will go to [place] location to get labs repeated in 48mo.

## 2020-07-15 ENCOUNTER — Other Ambulatory Visit: Payer: Self-pay

## 2020-07-15 ENCOUNTER — Telehealth (INDEPENDENT_AMBULATORY_CARE_PROVIDER_SITE_OTHER): Payer: 59 | Admitting: Family

## 2020-07-15 DIAGNOSIS — R42 Dizziness and giddiness: Secondary | ICD-10-CM

## 2020-07-15 MED ORDER — MECLIZINE HCL 25 MG PO TABS: 25.0000 mg | ORAL_TABLET | Freq: Three times a day (TID) | ORAL | 0 refills | Status: DC | PRN

## 2020-07-15 NOTE — Progress Notes (Signed)
Sandra Yates is a 60 y.o. female with the following history as recorded in EpicCare:  Patient Active Problem List   Diagnosis Date Noted  . GERD (gastroesophageal reflux disease) 05/12/2020  . Anemia, iron deficiency 05/05/2015  . Hot flashes 07/24/2014  . Insomnia 01/17/2014  . Health care maintenance 10/16/2013  . Hypothyroidism 05/20/2009    Current Outpatient Medications  Medication Sig Dispense Refill  . meclizine (ANTIVERT) 25 MG tablet Take 1 tablet (25 mg total) by mouth 3 (three) times daily as needed for dizziness. 30 tablet 0  . acetaminophen (TYLENOL) 500 MG tablet Take 1,000 mg by mouth every 8 (eight) hours as needed for mild pain or headache.    . Biotin 10000 MCG TABS Take 10,000 mcg by mouth daily.    . furosemide (LASIX) 20 MG tablet Take 1 tablet (20 mg total) by mouth daily. 30 tablet 3  . Ichthammol 20 % OINT Apply 1 application topically daily as needed (itching). Reported on 07/08/2015    . levothyroxine (SYNTHROID) 50 MCG tablet Take 1 tablet (50 mcg total) by mouth daily before breakfast. 90 tablet 3  . Multiple Vitamins-Minerals (WOMENS MULTI VITAMIN & MINERAL PO) Take 2 tablets by mouth daily.     . pantoprazole (PROTONIX) 40 MG tablet Take 1 tablet (40 mg total) by mouth daily. 90 tablet 3   No current facility-administered medications for this visit.    Allergies: Patient has no known allergies.  Past Medical History:  Diagnosis Date  . Allergy   . Anemia   . Bilateral swelling of feet and ankles   . Dizziness   . Fibroids   . Hypothyroidism   . Insomnia   . Irregular heart beat    prior to thyroid medication  . Thyroid disease   . Umbilical hernia 9/38/1829    Past Surgical History:  Procedure Laterality Date  . ABDOMINAL HYSTERECTOMY    . COLONOSCOPY    . DENTAL SURGERY    . DILATION AND CURETTAGE OF UTERUS    . HERNIA REPAIR    . HYSTERECTOMY ABDOMINAL WITH SALPINGECTOMY N/A 11/18/2016   Procedure: HYSTERECTOMY ABDOMINAL WITH BILATERAL  SALPINGECTOMY AND OOPHORECTOMY;  Surgeon: Azucena Fallen, MD;  Location: [facility];  Service: Gynecology;  Laterality: N/A;  Dr. Dalbert Batman is going first  . radioactive iodine treatment for thyroid    . UMBILICAL HERNIA REPAIR N/A 11/18/2016   Procedure: HERNIA REPAIR UMBILICAL ADULT;  Surgeon: Fanny Skates, MD;  Location: [facility];  Service: General;  Laterality: N/A;    Family History  Problem Relation Age of Onset  . Cancer Mother        lung primary/brain  . Diabetes Mother   . Cancer Father        ? lung  . Asthma Brother   . Diabetes Brother   . Diabetes Brother   . Kidney disease Brother   . Colon cancer Neg Hx     Social History   Tobacco Use  . Smoking status: Never Smoker  . Smokeless tobacco: Never Used  Substance Use Topics  . Alcohol use: Yes    Alcohol/week: 0.0 standard drinks    Comment: rare    Subjective:   I connected with Carolan D Meine on 07/15/20 at 11:00 AM EST by a video enabled telemedicine application and verified that I am speaking with the correct person using two identifiers.   I discussed the limitations of evaluation and management by telemedicine and the availability of in person appointments. The  patient expressed understanding and agreed to proceed. Provider in office/ patient is at home; provider and patient are only 2 people on video call. Provider in office/ patient is at home; provider and patient are only 2 people on video call.   Complaining of dizziness x 2 days; started suddenly on Monday afternoon- feels like "room is spinning around her." Symptoms are worse when changing positions; no vomiting; no recent head injury or loss of ability to speak; does not feel comfortable driving;  No prior history of vertigo;     Objective:  There were no vitals filed for this visit.  General: Well developed, well nourished, in no acute distress  Head: Normocephalic and atraumatic  Lungs: Respirations unlabored;  Neurologic: Alert and oriented; speech  intact; face symmetrical;   Assessment:  1. Vertigo     Plan:  Suspect BPPV; reassurance offered- will try treating with Antivert 25 mg tid prn; if no change in symptoms at all by tomorrow, she will need to be seen in office for in person evaluation;   Patient is concerned that she was due to have her cholesterol re-checked today but does not feel up to it- agree that she should wait and she understands that lab at [facility] is open without appointment- can go next week at her convenience when she feels better; reassurance that elevated cholesterol would not be the cause of the dizziness.  No follow-ups on file.  No orders of the defined types were placed in this encounter.   Requested Prescriptions   Signed Prescriptions Disp Refills  . meclizine (ANTIVERT) 25 MG tablet 30 tablet 0    Sig: Take 1 tablet (25 mg total) by mouth 3 (three) times daily as needed for dizziness.

## 2020-07-15 NOTE — Patient Instructions (Signed)
Benign Positional Vertigo Vertigo is the feeling that you or your surroundings are moving when they are not. Benign positional vertigo is the most common form of vertigo. This is usually a harmless condition (benign). This condition is positional. This means that symptoms are triggered by certain movements and positions. This condition can be dangerous if it occurs while you are doing something that could cause harm to you or others. This includes activities such as driving or operating machinery. What are the causes? The inner ear has fluid-filled canals that help your brain sense movement and balance. When the fluid moves, the brain receives messages about your body's position. With benign positional vertigo, crystals in the inner ear break free and disturb the inner ear area. This causes your brain to receive confusing messages about your body's position. What increases the risk? You are more likely to develop this condition if:  You are a woman.  You are 50 years of age or older.  You have recently had a head injury.  You have an inner ear disease. What are the signs or symptoms? Symptoms of this condition usually happen when you move your head or your eyes in different directions. Symptoms may start suddenly, and usually last for less than a minute. They include:  Loss of balance and falling.  Feeling like you are spinning or moving.  Feeling like your surroundings are spinning or moving.  Nausea and vomiting.  Blurred vision.  Dizziness.  Involuntary eye movement (nystagmus). Symptoms can be mild and cause only minor problems, or they can be severe and interfere with daily life. Episodes of benign positional vertigo may return (recur) over time. Symptoms may improve over time. How is this diagnosed? This condition may be diagnosed based on:  Your medical history.  Physical exam of the head, neck, and ears.  Positional tests to check for or stimulate vertigo. You may be  asked to turn your head and change positions, such as going from sitting to lying down. A health care provider will watch for symptoms of vertigo. You may be referred to a health care provider who specializes in ear, nose, and throat problems (ENT, or otolaryngologist) or a provider who specializes in disorders of the nervous system (neurologist). How is this treated? This condition may be treated in a session in which your health care provider moves your head in specific positions to help the displaced crystals in your inner ear move. Treatment for this condition may take several sessions. Surgery may be needed in severe cases, but this is rare. In some cases, benign positional vertigo may resolve on its own in 2-4 weeks.   Follow these instructions at home: Safety  Move slowly. Avoid sudden body or head movements or certain positions, as told by your health care provider.  Avoid driving until your health care provider says it is safe for you to do so.  Avoid operating heavy machinery until your health care provider says it is safe for you to do so.  Avoid doing any tasks that would be dangerous to you or others if vertigo occurs.  If you have trouble walking or keeping your balance, try using a cane for stability. If you feel dizzy or unstable, sit down right away.  Return to your normal activities as told by your health care provider. Ask your health care provider what activities are safe for you. General instructions  Take over-the-counter and prescription medicines only as told by your health care provider.  Drink enough fluid   to keep your urine pale yellow.  Keep all follow-up visits as told by your health care provider. This is important. Contact a health care provider if:  You have a fever.  Your condition gets worse or you develop new symptoms.  Your family or friends notice any behavioral changes.  You have nausea or vomiting that gets worse.  You have numbness or a  prickling and tingling sensation. Get help right away if you:  Have difficulty speaking or moving.  Are always dizzy.  Faint.  Develop severe headaches.  Have weakness in your legs or arms.  Have changes in your hearing or vision.  Develop a stiff neck.  Develop sensitivity to light. Summary  Vertigo is the feeling that you or your surroundings are moving when they are not. Benign positional vertigo is the most common form of vertigo.  This condition is caused by crystals in the inner ear that become displaced. This causes a disturbance in an area of the inner ear that helps your brain sense movement and balance.  Symptoms include loss of balance and falling, feeling that you or your surroundings are moving, nausea and vomiting, and blurred vision.  This condition can be diagnosed based on symptoms, a physical exam, and positional tests.  Follow safety instructions as told by your health care provider. You will also be told when to contact your health care provider in case of problems. This information is not intended to replace advice given to you by your health care provider. Make sure you discuss any questions you have with your health care provider. Document Revised: 04/02/2019 Document Reviewed: 10/18/2017 Elsevier Patient Education  2021 Elsevier Inc.  

## 2021-03-29 ENCOUNTER — Telehealth: Payer: Self-pay | Admitting: Internal Medicine

## 2021-03-29 NOTE — Telephone Encounter (Signed)
I would recommend visit as she did not follow up as recommended on labs after changing medication last year.

## 2021-03-29 NOTE — Telephone Encounter (Signed)
See below

## 2021-03-29 NOTE — Telephone Encounter (Signed)
Patient states she has been experiencing dizziness for 5 days  Patient states she does not have other symptoms at this time  Offered patient appt, patient declined stating she has an appt on 05-21-2021  Patient is requesting a call back to discuss symptoms  1.Medication Requested: meclizine (ANTIVERT) 25 MG tablet  2. Pharmacy (Name, Street, [place]): [place], [address]  3. On Med List: yes  4. Last Visit with PCP: 05-12-2021  5. Next visit date with PCP: 05-21-2021   Agent: Please be advised that RX refills may take up to 3 business days. We ask that you follow-up with your pharmacy.

## 2021-03-31 ENCOUNTER — Telehealth: Payer: Self-pay | Admitting: Internal Medicine

## 2021-03-31 NOTE — Telephone Encounter (Signed)
[facility] calling in  Received refill request for levothyroxine (SYNTHROID) 50 MCG tablet  They have changed manufacturers & wants to get okay from provider before proceeding w/ refill  [place], [place] Phone:  [phone]  Fax:  [phone]

## 2021-03-31 NOTE — Telephone Encounter (Signed)
See below

## 2021-03-31 NOTE — Telephone Encounter (Signed)
Pt is scheduled for Friday 11.11.2022 for a medication refill

## 2021-04-01 NOTE — Telephone Encounter (Signed)
Okay to proceed.  

## 2021-04-01 NOTE — Telephone Encounter (Signed)
Spoke with pharmacy and gave them the verbals from Dr. Sharlet Salina to switch manufacturers.

## 2021-04-02 ENCOUNTER — Telehealth: Payer: Self-pay | Admitting: Internal Medicine

## 2021-04-02 ENCOUNTER — Ambulatory Visit: Payer: 59 | Admitting: Internal Medicine

## 2021-04-02 MED ORDER — LEVOTHYROXINE SODIUM 50 MCG PO TABS: 50.0000 ug | ORAL_TABLET | Freq: Every day | ORAL | 0 refills | Status: DC

## 2021-04-02 NOTE — Telephone Encounter (Signed)
1.Medication Requested: levothyroxine (SYNTHROID) 50 MCG tablet 2. Pharmacy (Name, Street, [place]): [facility], [address] Phone:  [phone]  Fax:  [phone]     3. On Med List: Y  4. Last Visit with PCP:  5. Next visit date with PCP:  Pt. Completely out of medication. Requesting short supply, until visit with provider.   Please advise.   Agent: Please be advised that RX refills may take up to 3 business days. We ask that you follow-up with your pharmacy.

## 2021-04-02 NOTE — Telephone Encounter (Signed)
Patient is requesting a limited supply of medication. OV had to be pushed back to 04/08/2021 due to provider being out of the office. Stated she has been out of her medication for a week already.   Please advise.   Preferred pharmacy:  [place], [place] - [address] Phone:  [phone]  Fax:  [phone]

## 2021-04-02 NOTE — Telephone Encounter (Signed)
Pt is schedule in Dec for CPX. You can cancel appt for 11/16 for med refill. Enough med has been sent to pharmacy until CPX.Marland KitchenJohny Chess

## 2021-04-05 ENCOUNTER — Ambulatory Visit: Payer: 59 | Admitting: Internal Medicine

## 2021-04-05 MED ORDER — MECLIZINE HCL 25 MG PO TABS
25.0000 mg | ORAL_TABLET | Freq: Three times a day (TID) | ORAL | 0 refills | Status: DC | PRN
Start: 2021-04-05 — End: 2021-10-27

## 2021-04-05 NOTE — Telephone Encounter (Signed)
Does pt still need to be seen in office before medication can be refilled.

## 2021-04-05 NOTE — Telephone Encounter (Signed)
Sent in small amount of meclizine but still needs to keep visit.

## 2021-04-05 NOTE — Addendum Note (Signed)
Addended by: [name] A on: 04/05/2021 08:12 AM   Modules accepted: Orders

## 2021-04-07 ENCOUNTER — Encounter: Payer: Self-pay | Admitting: Internal Medicine

## 2021-04-07 ENCOUNTER — Ambulatory Visit (INDEPENDENT_AMBULATORY_CARE_PROVIDER_SITE_OTHER): Payer: 59 | Admitting: Internal Medicine

## 2021-04-07 ENCOUNTER — Other Ambulatory Visit: Payer: Self-pay

## 2021-04-07 VITALS — BP 118/70 | HR 108 | Resp 18 | Ht 66.0 in | Wt 163.0 lb

## 2021-04-07 DIAGNOSIS — E890 Postprocedural hypothyroidism: Secondary | ICD-10-CM | POA: Diagnosis not present

## 2021-04-07 DIAGNOSIS — Z0000 Encounter for general adult medical examination without abnormal findings: Secondary | ICD-10-CM | POA: Diagnosis not present

## 2021-04-07 DIAGNOSIS — D500 Iron deficiency anemia secondary to blood loss (chronic): Secondary | ICD-10-CM | POA: Diagnosis not present

## 2021-04-07 DIAGNOSIS — R42 Dizziness and giddiness: Secondary | ICD-10-CM

## 2021-04-07 LAB — LIPID PANEL
Cholesterol: 283 mg/dL — ABNORMAL HIGH (ref 0–200)
HDL: 50.5 mg/dL (ref >39.00)
LDL Cholesterol: 197 mg/dL — ABNORMAL HIGH (ref 0–99)
NonHDL: 232.19
Total CHOL/HDL Ratio: 6
Triglycerides: 177 mg/dL — ABNORMAL HIGH (ref 0.0–149.0)
VLDL: 35.4 mg/dL (ref 0.0–40.0)

## 2021-04-07 LAB — COMPREHENSIVE METABOLIC PANEL
ALT: 35 U/L (ref 0–35)
AST: 28 U/L (ref 0–37)
Albumin: 4.3 g/dL (ref 3.5–5.2)
Alkaline Phosphatase: 56 U/L (ref 39–117)
BUN: 12 mg/dL (ref 6–23)
CO2: 28 mEq/L (ref 19–32)
Calcium: 10.1 mg/dL (ref 8.4–10.5)
Chloride: 102 mEq/L (ref 96–112)
Creatinine, Ser: 0.84 mg/dL (ref 0.40–1.20)
GFR: 76.71 mL/min (ref >60.00)
Glucose, Bld: 78 mg/dL (ref 70–99)
Potassium: 4 mEq/L (ref 3.5–5.1)
Sodium: 139 mEq/L (ref 135–145)
Total Bilirubin: 0.7 mg/dL (ref 0.2–1.2)
Total Protein: 8 g/dL (ref 6.0–8.3)

## 2021-04-07 LAB — CBC
HCT: 40.6 % (ref 36.0–46.0)
Hemoglobin: 13.8 g/dL (ref 12.0–15.0)
MCHC: 34 g/dL (ref 30.0–36.0)
MCV: 92 fl (ref 78.0–100.0)
Platelets: 182 10*3/uL (ref 150.0–400.0)
RBC: 4.41 Mil/uL (ref 3.87–5.11)
RDW: 13.5 % (ref 11.5–15.5)
WBC: 3.8 10*3/uL — ABNORMAL LOW (ref 4.0–10.5)

## 2021-04-07 LAB — TSH: TSH: 3.52 u[IU]/mL (ref 0.35–5.50)

## 2021-04-07 LAB — T4, FREE: Free T4: 0.86 ng/dL (ref 0.60–1.60)

## 2021-04-07 NOTE — Assessment & Plan Note (Signed)
Did not follow up for labs after dosage adjustment to 50 mcg daily synthroid so needs labs today. Ordered TSH and free T4 and adjust as needed.

## 2021-04-07 NOTE — Assessment & Plan Note (Signed)
Checking CBC for stability given new dizziness.

## 2021-04-07 NOTE — Progress Notes (Signed)
   Subjective:   Patient ID: Sandra Yates, female    DOB: 1961/01/07, 60 y.o.   MRN: [id number]  Dizziness Pertinent negatives include no abdominal pain, chest pain, coughing, nausea or vomiting.  The patient is a 60 YO female coming in for dizziness. Started about 2 weeks ago and overall improving but not gone. No known triggers.   Review of Systems  Constitutional: Negative.   HENT: Negative.    Eyes: Negative.   Respiratory:  Negative for cough, chest tightness and shortness of breath.   Cardiovascular:  Negative for chest pain, palpitations and leg swelling.  Gastrointestinal:  Negative for abdominal distention, abdominal pain, constipation, diarrhea, nausea and vomiting.  Musculoskeletal: Negative.   Skin: Negative.   Neurological:  Positive for dizziness.  Psychiatric/Behavioral: Negative.     Objective:  Physical Exam Constitutional:      Appearance: She is well-developed.  HENT:     Head: Normocephalic and atraumatic.     Right Ear: Tympanic membrane normal.     Left Ear: Tympanic membrane normal.  Cardiovascular:     Rate and Rhythm: Normal rate and regular rhythm.  Pulmonary:     Effort: Pulmonary effort is normal. No respiratory distress.     Breath sounds: Normal breath sounds. No wheezing or rales.  Abdominal:     General: Bowel sounds are normal. There is no distension.     Palpations: Abdomen is soft.     Tenderness: There is no abdominal tenderness. There is no rebound.  Musculoskeletal:     Cervical back: Normal range of motion.  Skin:    General: Skin is warm and dry.  Neurological:     Mental Status: She is alert and oriented to person, place, and time.     Coordination: Coordination normal.    Vitals:   04/07/21 0849  BP: 118/70  Pulse: (!) 108  Resp: 18  SpO2: 99%  Weight: 163 lb (73.9 kg)  Height: 5\' 6"  (1.676 m)    This visit occurred during the SARS-CoV-2 public health emergency.  Safety protocols were in place, including screening  questions prior to the visit, additional usage of staff PPE, and extensive cleaning of exam room while observing appropriate contact time as indicated for disinfecting solutions.   Assessment & Plan:

## 2021-04-07 NOTE — Patient Instructions (Addendum)
It is okay to half the meclizine to take for the dizziness.  We will do the labs.

## 2021-04-07 NOTE — Assessment & Plan Note (Signed)
Sounds to be BPPV but no known trigger. Checking CBC, CMP, thyroid studies to rule out cause. She has rx for meclizine but this makes her drowsy. Advised to cut in half and try that. Given information about epley maneuver to help and continue long term to avoid recurrence.

## 2021-04-08 ENCOUNTER — Ambulatory Visit: Payer: 59 | Admitting: Internal Medicine

## 2021-04-14 ENCOUNTER — Other Ambulatory Visit: Payer: Self-pay | Admitting: Internal Medicine

## 2021-04-14 MED ORDER — PRAVASTATIN SODIUM 20 MG PO TABS: 20.0000 mg | ORAL_TABLET | Freq: Every day | ORAL | 3 refills | Status: DC

## 2021-05-21 ENCOUNTER — Other Ambulatory Visit: Payer: Self-pay

## 2021-05-21 ENCOUNTER — Ambulatory Visit (INDEPENDENT_AMBULATORY_CARE_PROVIDER_SITE_OTHER): Payer: 59 | Admitting: Internal Medicine

## 2021-05-21 ENCOUNTER — Encounter: Payer: 59 | Admitting: Internal Medicine

## 2021-05-21 ENCOUNTER — Encounter: Payer: Self-pay | Admitting: Internal Medicine

## 2021-05-21 VITALS — BP 118/80 | HR 80 | Resp 18 | Ht 66.0 in | Wt 161.0 lb

## 2021-05-21 DIAGNOSIS — E782 Mixed hyperlipidemia: Secondary | ICD-10-CM | POA: Diagnosis not present

## 2021-05-21 DIAGNOSIS — K219 Gastro-esophageal reflux disease without esophagitis: Secondary | ICD-10-CM | POA: Diagnosis not present

## 2021-05-21 DIAGNOSIS — Z0000 Encounter for general adult medical examination without abnormal findings: Secondary | ICD-10-CM

## 2021-05-21 LAB — LIPID PANEL
Cholesterol: 196 mg/dL (ref 0–200)
HDL: 43.8 mg/dL (ref >39.00)
LDL Cholesterol: 112 mg/dL — ABNORMAL HIGH (ref 0–99)
NonHDL: 152.22
Total CHOL/HDL Ratio: 4
Triglycerides: 200 mg/dL — ABNORMAL HIGH (ref 0.0–149.0)
VLDL: 40 mg/dL (ref 0.0–40.0)

## 2021-05-21 MED ORDER — PANTOPRAZOLE SODIUM 40 MG PO TBEC: 40.0000 mg | DELAYED_RELEASE_TABLET | Freq: Every day | ORAL | 3 refills | Status: AC

## 2021-05-21 NOTE — Assessment & Plan Note (Signed)
Flu shot up to date. Covid-19 booster counseled. Shingrix counseled. Tetanus counseled. Colonoscopy up to date. Mammogram up to date, pap smear not indicated. Counseled about sun safety and mole surveillance. Counseled about the dangers of distracted driving. Given 10 year screening recommendations.

## 2021-05-21 NOTE — Progress Notes (Signed)
° °  Subjective:   Patient ID: Sandra Yates, female    DOB: 02-Jan-1961, 60 y.o.   MRN: [id number]  HPI The patient is a 60 YO female coming in for physical.   PMH, [place], social history reviewed and updated  Review of Systems  Constitutional: Negative.   HENT: Negative.    Eyes: Negative.   Respiratory:  Negative for cough, chest tightness and shortness of breath.   Cardiovascular:  Negative for chest pain, palpitations and leg swelling.  Gastrointestinal:  Negative for abdominal distention, abdominal pain, constipation, diarrhea, nausea and vomiting.  Musculoskeletal: Negative.   Skin: Negative.   Neurological: Negative.   Psychiatric/Behavioral: Negative.     Objective:  Physical Exam Constitutional:      Appearance: She is well-developed.  HENT:     Head: Normocephalic and atraumatic.  Cardiovascular:     Rate and Rhythm: Normal rate and regular rhythm.  Pulmonary:     Effort: Pulmonary effort is normal. No respiratory distress.     Breath sounds: Normal breath sounds. No wheezing or rales.  Abdominal:     General: Bowel sounds are normal. There is no distension.     Palpations: Abdomen is soft.     Tenderness: There is no abdominal tenderness. There is no rebound.  Musculoskeletal:     Cervical back: Normal range of motion.  Skin:    General: Skin is warm and dry.  Neurological:     Mental Status: She is alert and oriented to person, place, and time.     Coordination: Coordination normal.    Vitals:   05/21/21 1046  BP: 118/80  Pulse: 80  Resp: 18  SpO2: 100%  Weight: 161 lb (73 kg)  Height: 5\' 6"  (1.676 m)    This visit occurred during the SARS-CoV-2 public health emergency.  Safety protocols were in place, including screening questions prior to the visit, additional usage of staff PPE, and extensive cleaning of exam room while observing appropriate contact time as indicated for disinfecting solutions.   Assessment & Plan:

## 2021-05-21 NOTE — Assessment & Plan Note (Signed)
Refilled protonix for 1 year supply. Symptoms are controlled on current regimen.

## 2021-05-21 NOTE — Patient Instructions (Signed)
We will recheck the cholesterol.

## 2021-05-21 NOTE — Assessment & Plan Note (Signed)
Started on pravastatin 20 mg daily about a month ago and tolerating well. Checking lipid panel.

## 2021-06-29 ENCOUNTER — Telehealth: Payer: Self-pay

## 2021-06-29 MED ORDER — LEVOTHYROXINE SODIUM 50 MCG PO TABS: 50.0000 ug | ORAL_TABLET | Freq: Every day | ORAL | 0 refills | Status: DC

## 2021-06-29 NOTE — Telephone Encounter (Signed)
Pt calling requesting a refill for: levothyroxine (SYNTHROID) 50 MCG tablet  Pharmacy: [place], [address]  LOV: 05/21/21  Pt CB [phone]

## 2021-06-29 NOTE — Telephone Encounter (Signed)
Refill has been sent to the pt's pharmacy  

## 2021-10-01 ENCOUNTER — Other Ambulatory Visit: Payer: Self-pay | Admitting: Internal Medicine

## 2021-10-04 ENCOUNTER — Telehealth: Payer: Self-pay

## 2021-10-04 NOTE — Telephone Encounter (Signed)
Pt requesting a refill on: ?levothyroxine (SYNTHROID) 50 MCG tablet ? ?Pharmacy: ?[place], [address] ? ?Requesting 3 mo supply ? ?LOV 05/21/21 ? ?

## 2021-10-04 NOTE — Telephone Encounter (Signed)
Refill has been sent to the pt's pharmacy  

## 2021-10-27 ENCOUNTER — Telehealth: Payer: Self-pay | Admitting: Internal Medicine

## 2021-10-27 ENCOUNTER — Ambulatory Visit (INDEPENDENT_AMBULATORY_CARE_PROVIDER_SITE_OTHER): Payer: 59 | Admitting: Internal Medicine

## 2021-10-27 ENCOUNTER — Encounter: Payer: Self-pay | Admitting: Internal Medicine

## 2021-10-27 VITALS — BP 122/74 | HR 97 | Temp 98.0°F | Ht 66.0 in | Wt 162.0 lb

## 2021-10-27 DIAGNOSIS — E890 Postprocedural hypothyroidism: Secondary | ICD-10-CM | POA: Diagnosis not present

## 2021-10-27 DIAGNOSIS — K219 Gastro-esophageal reflux disease without esophagitis: Secondary | ICD-10-CM

## 2021-10-27 DIAGNOSIS — R002 Palpitations: Secondary | ICD-10-CM

## 2021-10-27 DIAGNOSIS — R0789 Other chest pain: Secondary | ICD-10-CM | POA: Diagnosis not present

## 2021-10-27 LAB — CBC WITH DIFFERENTIAL/PLATELET
Basophils Absolute: 0 10*3/uL (ref 0.0–0.1)
Basophils Relative: 0.8 % (ref 0.0–3.0)
Eosinophils Absolute: 0.1 10*3/uL (ref 0.0–0.7)
Eosinophils Relative: 1.6 % (ref 0.0–5.0)
HCT: 40.7 % (ref 36.0–46.0)
Hemoglobin: 13.9 g/dL (ref 12.0–15.0)
Lymphocytes Relative: 42.1 % (ref 12.0–46.0)
Lymphs Abs: 1.8 10*3/uL (ref 0.7–4.0)
MCHC: 34.1 g/dL (ref 30.0–36.0)
MCV: 91.6 fl (ref 78.0–100.0)
Monocytes Absolute: 0.4 10*3/uL (ref 0.1–1.0)
Monocytes Relative: 9.2 % (ref 3.0–12.0)
Neutro Abs: 1.9 10*3/uL (ref 1.4–7.7)
Neutrophils Relative %: 46.3 % (ref 43.0–77.0)
Platelets: 181 10*3/uL (ref 150.0–400.0)
RBC: 4.44 Mil/uL (ref 3.87–5.11)
RDW: 13 % (ref 11.5–15.5)
WBC: 4.2 10*3/uL (ref 4.0–10.5)

## 2021-10-27 LAB — COMPREHENSIVE METABOLIC PANEL
ALT: 42 U/L — ABNORMAL HIGH (ref 0–35)
AST: 31 U/L (ref 0–37)
Albumin: 4.4 g/dL (ref 3.5–5.2)
Alkaline Phosphatase: 60 U/L (ref 39–117)
BUN: 11 mg/dL (ref 6–23)
CO2: 29 mEq/L (ref 19–32)
Calcium: 10.6 mg/dL — ABNORMAL HIGH (ref 8.4–10.5)
Chloride: 101 mEq/L (ref 96–112)
Creatinine, Ser: 0.8 mg/dL (ref 0.40–1.20)
GFR: 81.02 mL/min (ref >60.00)
Glucose, Bld: 88 mg/dL (ref 70–99)
Potassium: 3.9 mEq/L (ref 3.5–5.1)
Sodium: 139 mEq/L (ref 135–145)
Total Bilirubin: 0.7 mg/dL (ref 0.2–1.2)
Total Protein: 8.1 g/dL (ref 6.0–8.3)

## 2021-10-27 LAB — TSH: TSH: 2.4 u[IU]/mL (ref 0.35–5.50)

## 2021-10-27 MED ORDER — FAMOTIDINE 40 MG PO TABS: 40.0000 mg | ORAL_TABLET | Freq: Every day | ORAL | 5 refills | Status: DC

## 2021-10-27 MED ORDER — PRAVASTATIN SODIUM 20 MG PO TABS: 20.0000 mg | ORAL_TABLET | Freq: Every day | ORAL | 3 refills | Status: AC

## 2021-10-27 MED ORDER — MECLIZINE HCL 25 MG PO TABS: 25.0000 mg | ORAL_TABLET | Freq: Three times a day (TID) | ORAL | 0 refills | Status: AC | PRN

## 2021-10-27 NOTE — Telephone Encounter (Signed)
Sandra Yates called and said that the imaging location that Dr. Quay Burow wanted her to go to for her gall bladder scan is not in network with her insurance.  Sandra Yates will call her insurance company and find out where they would like her to go.

## 2021-10-27 NOTE — Patient Instructions (Addendum)
    An EKG was done   Blood work was ordered.     Medications changes include :   famotidine 40 mg daily in evening/night.  Take meclizine at night.     Your prescription(s) have been sent to your pharmacy.    An abdominal US was ordered.     Someone will call you to schedule an appointment.    Return if symptoms worsen or fail to improve.

## 2021-10-27 NOTE — Progress Notes (Signed)
Subjective:    Patient ID: Sandra Yates, female    DOB: Oct 20, 1960, 61 y.o.   MRN: [id number]      HPI Sandra Yates is here for  Chief Complaint  Patient presents with   chest discomfort    Acid reflux     GERD / chest discomfort x 2 weeks.  She is taking the protonix daily.    Chest discomfort - it is bad at night, but feels it during the day.  Worse when going to bed - once she falls asleep she will not wake up.  She feels it now.  If she eats a lof of fruit it makes it worse.  It feels like a wound.  She had this a couple of years ago and she went to the ED and it was her GERD.  Has had some palpitations.  Symptoms not worse with activity.    She feels whoozing, her head feels heavy, not like her usual vertigo.  This started about 2 weeks ago as well.     There has been changes in her diet in the past two weeks.  She is eating more fruits and veges, eating more eggs.  She does not eat a big meal at night.   No changes in supplements/ meds.  Denies Nsaids.     Medications and allergies reviewed with patient and updated if appropriate.  Current Outpatient Medications on File Prior to Visit  Medication Sig Dispense Refill   acetaminophen (TYLENOL) 500 MG tablet Take 1,000 mg by mouth every 8 (eight) hours as needed for mild pain or headache.     Biotin 10000 MCG TABS Take 10,000 mcg by mouth daily.     furosemide (LASIX) 20 MG tablet Take 1 tablet (20 mg total) by mouth daily. 30 tablet 3   Ichthammol 20 % OINT Apply 1 application topically daily as needed (itching). Reported on 07/08/2015     levothyroxine (SYNTHROID) 50 MCG tablet Take 1 tablet (50 mcg total) by mouth daily before breakfast. 90 tablet 0   meclizine (ANTIVERT) 25 MG tablet Take 1 tablet (25 mg total) by mouth 3 (three) times daily as needed for dizziness. 10 tablet 0   Multiple Vitamins-Minerals (WOMENS MULTI VITAMIN & MINERAL PO) Take 2 tablets by mouth daily.      pantoprazole (PROTONIX) 40 MG tablet Take 1  tablet (40 mg total) by mouth daily. 90 tablet 3   pravastatin (PRAVACHOL) 20 MG tablet Take 1 tablet (20 mg total) by mouth daily. 90 tablet 3   No current facility-administered medications on file prior to visit.    Review of Systems  Constitutional:  Negative for fever.  HENT:  Negative for trouble swallowing.   Respiratory:  Positive for cough (mild) and shortness of breath (mild). Negative for wheezing.   Cardiovascular:  Positive for chest pain and palpitations (irregular at times). Negative for leg swelling.  Gastrointestinal:  Negative for abdominal pain, blood in stool, constipation, diarrhea and nausea.       Gerd  Genitourinary:  Negative for dysuria, frequency and hematuria.  Neurological:  Negative for dizziness, light-headedness and headaches.       Whoozy, heavy-headed - no spinning      Objective:   Vitals:   10/27/21 1053  BP: 122/74  Pulse: 97  Temp: 98 F (36.7 C)  SpO2: 97%   BP Readings from Last 3 Encounters:  10/27/21 122/74  05/21/21 118/80  04/07/21 118/70   Wt Readings from Last 3  Encounters:  10/27/21 162 lb (73.5 kg)  05/21/21 161 lb (73 kg)  04/07/21 163 lb (73.9 kg)   Body mass index is 26.15 kg/m.    Physical Exam Constitutional:      General: She is not in acute distress.    Appearance: Normal appearance.  HENT:     Head: Normocephalic and atraumatic.  Eyes:     Conjunctiva/sclera: Conjunctivae normal.  Cardiovascular:     Rate and Rhythm: Normal rate and regular rhythm.     Heart sounds: Normal heart sounds. No murmur heard. Pulmonary:     Effort: Pulmonary effort is normal. No respiratory distress.     Breath sounds: Normal breath sounds. No wheezing.  Chest:     Chest wall: No tenderness.  Abdominal:     General: There is no distension.     Palpations: Abdomen is soft.     Tenderness: There is no abdominal tenderness. There is no guarding or rebound.  Musculoskeletal:     Cervical back: Neck supple.     Right lower  leg: No edema.     Left lower leg: No edema.  Lymphadenopathy:     Cervical: No cervical adenopathy.  Skin:    General: Skin is warm and dry.     Findings: No rash.  Neurological:     Mental Status: She is alert. Mental status is at baseline.  Psychiatric:        Mood and Affect: Mood normal.        Behavior: Behavior normal.           Assessment & Plan:    Chest discomfort, palpitations: Chest discomfort is mid chest and worse at night when going to bed Started about 2 weeks ago Associated with some palpitations, GERD Had similar symptoms a few years ago and went to the emergency room work-up was negative for cardiac cause and was advised that it was all related to her GERD We will revise GERD medications EKG today: NSR 80 bpm, possible LAE, otherwise normal EKG.  No change compared to EKG from 03/27/2018 I do not feel her chest discomfort is cardiac in nature-more likely GI-GERD versus gallbladder disease CBC, CMP, TSH   Hypothyroid, post ablative: Chronic  Some palpitations, but otherwise clinically euthyroid We will check TSH Continue levothyroxine 50 mcg daily  Atypical dizziness: Has a history of vertigo She is having some wooziness, heavy headedness, but no true spinning Possibly atypical dizziness Will take meclizine 25 mg at night for a few days to see if that helps-refilled  GERD: Chronic Was controlled up until 2 weeks ago.  She did change her diet at the time and has been eating more vegetables, fruits and eggs ?  Flare of GERD versus gallbladder disease-CT scan a few years ago showed gallbladder with stones and sludge Continue pantoprazole 40 mg daily Start famotidine 40 mg daily in the evening or at night Ultrasound of abdomen Avoid rich, fatty foods-maintain bland diet.  Advise decreasing some of the vegetables to see if that helps CBC, CMP  Hyperlipidemia: She stopped taking her cholesterol medication Advised that she restart Pravastatin 20 mg  daily-prescription sent to pharmacy    Chest discomfort - Plan: CBC with Differential/Platelet, Comprehensive metabolic panel, TSH, EKG 06-TKZS, US Abdomen Limited RUQ (LIVER/GB), TSH, Comprehensive metabolic panel, CBC with Differential/Platelet  Palpitations - Plan: CBC with Differential/Platelet, Comprehensive metabolic panel, TSH, EKG 01-UXNA, TSH, Comprehensive metabolic panel, CBC with Differential/Platelet  Postablative hypothyroidism - Plan: TSH, TSH  Gastroesophageal reflux  disease, unspecified whether esophagitis present - Plan: US Abdomen Limited RUQ (LIVER/GB)

## 2021-10-28 ENCOUNTER — Telehealth: Payer: Self-pay | Admitting: Internal Medicine

## 2021-10-28 DIAGNOSIS — R1010 Upper abdominal pain, unspecified: Secondary | ICD-10-CM

## 2021-10-28 NOTE — Telephone Encounter (Signed)
Pt called in requesting a referral to [place] [facility] to have an ultrasound of her gall bladder- as they accept her insurance.

## 2021-11-08 ENCOUNTER — Ambulatory Visit (HOSPITAL_BASED_OUTPATIENT_CLINIC_OR_DEPARTMENT_OTHER)
Admission: RE | Admit: 2021-11-08 | Discharge: 2021-11-08 | Disposition: A | Discharge status: Home / Self Care | Payer: 59 | Source: Ambulatory Visit | Attending: Internal Medicine | Admitting: Internal Medicine

## 2021-11-08 DIAGNOSIS — R1010 Upper abdominal pain, unspecified: Secondary | ICD-10-CM | POA: Insufficient documentation

## 2021-11-08 DIAGNOSIS — K8020 Calculus of gallbladder without cholecystitis without obstruction: Secondary | ICD-10-CM | POA: Insufficient documentation

## 2021-11-08 DIAGNOSIS — R1011 Right upper quadrant pain: Secondary | ICD-10-CM | POA: Diagnosis present

## 2021-12-30 ENCOUNTER — Telehealth: Payer: Self-pay

## 2021-12-30 MED ORDER — LEVOTHYROXINE SODIUM 50 MCG PO TABS: 50.0000 ug | ORAL_TABLET | Freq: Every day | ORAL | 0 refills | Status: DC

## 2021-12-30 NOTE — Telephone Encounter (Signed)
Pt is requesting refill: levothyroxine (SYNTHROID) 50 MCG tablet  Pharmacy: [facility], [place] 05/21/21 ROV will schedule closer to 05/21/22

## 2021-12-30 NOTE — Telephone Encounter (Signed)
Refill has been sent to the pt's pharmacy  

## 2022-03-04 LAB — HM MAMMOGRAPHY

## 2022-03-08 ENCOUNTER — Encounter: Payer: Self-pay | Admitting: *Deleted

## 2022-03-21 ENCOUNTER — Other Ambulatory Visit: Payer: Self-pay | Admitting: Internal Medicine

## 2022-03-25 ENCOUNTER — Ambulatory Visit: Payer: Self-pay | Admitting: Internal Medicine

## 2022-04-05 ENCOUNTER — Ambulatory Visit (INDEPENDENT_AMBULATORY_CARE_PROVIDER_SITE_OTHER): Payer: Commercial Managed Care - HMO

## 2022-04-05 ENCOUNTER — Ambulatory Visit: Payer: Commercial Managed Care - HMO | Admitting: Internal Medicine

## 2022-04-05 ENCOUNTER — Ambulatory Visit: Payer: Commercial Managed Care - HMO | Attending: Internal Medicine

## 2022-04-05 VITALS — BP 118/80 | HR 104 | Temp 97.8°F | Ht 66.0 in | Wt 161.0 lb

## 2022-04-05 DIAGNOSIS — R002 Palpitations: Secondary | ICD-10-CM

## 2022-04-05 DIAGNOSIS — R0789 Other chest pain: Secondary | ICD-10-CM

## 2022-04-05 DIAGNOSIS — R052 Subacute cough: Secondary | ICD-10-CM

## 2022-04-05 LAB — COMPREHENSIVE METABOLIC PANEL
ALT: 53 U/L — ABNORMAL HIGH (ref 0–35)
AST: 35 U/L (ref 0–37)
Albumin: 4.5 g/dL (ref 3.5–5.2)
Alkaline Phosphatase: 64 U/L (ref 39–117)
BUN: 9 mg/dL (ref 6–23)
CO2: 30 mEq/L (ref 19–32)
Calcium: 10.3 mg/dL (ref 8.4–10.5)
Chloride: 102 mEq/L (ref 96–112)
Creatinine, Ser: 1.01 mg/dL (ref 0.40–1.20)
GFR: 61.06 mL/min (ref >60.00)
Glucose, Bld: 94 mg/dL (ref 70–99)
Potassium: 3.7 mEq/L (ref 3.5–5.1)
Sodium: 139 mEq/L (ref 135–145)
Total Bilirubin: 0.7 mg/dL (ref 0.2–1.2)
Total Protein: 8.5 g/dL — ABNORMAL HIGH (ref 6.0–8.3)

## 2022-04-05 LAB — CBC
HCT: 40.4 % (ref 36.0–46.0)
Hemoglobin: 13.7 g/dL (ref 12.0–15.0)
MCHC: 34 g/dL (ref 30.0–36.0)
MCV: 92.4 fl (ref 78.0–100.0)
Platelets: 198 10*3/uL (ref 150.0–400.0)
RBC: 4.37 Mil/uL (ref 3.87–5.11)
RDW: 13.5 % (ref 11.5–15.5)
WBC: 4.6 10*3/uL (ref 4.0–10.5)

## 2022-04-05 LAB — T4, FREE: Free T4: 0.86 ng/dL (ref 0.60–1.60)

## 2022-04-05 LAB — BRAIN NATRIURETIC PEPTIDE: Pro B Natriuretic peptide (BNP): 20 pg/mL (ref 0.0–100.0)

## 2022-04-05 LAB — TSH: TSH: 5.34 u[IU]/mL (ref 0.35–5.50)

## 2022-04-05 NOTE — Progress Notes (Unsigned)
Enrolled for [employer] to mail a ZIO XT long term holter monitor to the patients address on file.   DOD to read. 

## 2022-04-05 NOTE — Progress Notes (Unsigned)
   Subjective:   Patient ID: Sandra Yates, female    DOB: 04-01-1961, 61 y.o.   MRN: [id number]  HPI The patient is a 61 YO female coming in for new chest discomfort and cough at night time. She is having some left flank pain with lying down since summer which is gradually improved but still present. She does have GERD but not sure this is active now. She is taking protonix and pepcid. She just does not feel right. Worsening in last 1-2 months.  Review of Systems  Constitutional:  Positive for activity change.  HENT: Negative.    Eyes: Negative.   Respiratory:  Positive for cough. Negative for chest tightness and shortness of breath.   Cardiovascular:  Positive for chest pain. Negative for palpitations and leg swelling.  Gastrointestinal:  Negative for abdominal distention, abdominal pain, constipation, diarrhea, nausea and vomiting.  Musculoskeletal:  Positive for arthralgias and myalgias.  Skin: Negative.   Neurological: Negative.   Psychiatric/Behavioral: Negative.      Objective:  Physical Exam Constitutional:      Appearance: She is well-developed.  HENT:     Head: Normocephalic and atraumatic.  Cardiovascular:     Rate and Rhythm: Normal rate and regular rhythm.  Pulmonary:     Effort: Pulmonary effort is normal. No respiratory distress.     Breath sounds: Normal breath sounds. No wheezing or rales.  Abdominal:     General: Bowel sounds are normal. There is no distension.     Palpations: Abdomen is soft.     Tenderness: There is no abdominal tenderness. There is no rebound.  Musculoskeletal:     Cervical back: Normal range of motion.  Skin:    General: Skin is warm and dry.  Neurological:     Mental Status: She is alert and oriented to person, place, and time.     Coordination: Coordination normal.     Vitals:   04/05/22 1351  BP: 118/80  Pulse: (!) 104  Temp: 97.8 F (36.6 C)  TempSrc: Oral  SpO2: 99%  Weight: 161 lb (73 kg)  Height: '5\' 6"'$  (1.676 m)     Assessment & Plan:

## 2022-04-05 NOTE — Patient Instructions (Signed)
We will get the heart monitor. We are checking the chest x-ray and labs today.

## 2022-04-06 ENCOUNTER — Encounter: Payer: Self-pay | Admitting: Internal Medicine

## 2022-04-06 DIAGNOSIS — R0789 Other chest pain: Secondary | ICD-10-CM | POA: Insufficient documentation

## 2022-04-06 DIAGNOSIS — R052 Subacute cough: Secondary | ICD-10-CM | POA: Insufficient documentation

## 2022-04-06 LAB — D-DIMER, QUANTITATIVE: D-Dimer, Quant: 0.24 mcg/mL FEU (ref <0.50)

## 2022-04-06 NOTE — Assessment & Plan Note (Signed)
Checking CXR and BNP today. It is possible this is relate to GERD. She is already taking protonix 40 mg daily and pepcid 40 mg qhs. Treat as appropriate.

## 2022-04-06 NOTE — Assessment & Plan Note (Signed)
Checking d-dimer to rule out PE, CXR to rule out pneumonia. BNP to rule out heart failure. Checking CBC and CMP. It is possible that this is related to GERD although it is not typical for GERD. She is already on maximal GERD treatment.

## 2022-04-06 NOTE — Assessment & Plan Note (Signed)
Going on but worsening lately. No prior holter monitoring. Ordered holter monitor for 14 days. Checking TSH and free T4 today and CBC and CMP to rule out metabolic causes.

## 2022-04-07 ENCOUNTER — Telehealth: Payer: Self-pay | Admitting: Internal Medicine

## 2022-04-07 DIAGNOSIS — R002 Palpitations: Secondary | ICD-10-CM

## 2022-04-07 NOTE — Telephone Encounter (Signed)
Pt is requesting a call to discuss most recent blood work.   Please call Danica at: [phone]

## 2022-04-08 NOTE — Telephone Encounter (Signed)
Yes, those medicines can impact the liver numbers. It is okay to take melatonin

## 2022-04-12 NOTE — Telephone Encounter (Signed)
Spoke with pt and was able to inform her of Dr. Nathanial Millman advice and pt understood. Pt does not have any questions or concerns at this time.

## 2022-04-30 ENCOUNTER — Other Ambulatory Visit: Payer: Self-pay | Admitting: Internal Medicine

## 2022-05-03 ENCOUNTER — Other Ambulatory Visit: Payer: Self-pay | Admitting: Internal Medicine

## 2022-05-03 DIAGNOSIS — R002 Palpitations: Secondary | ICD-10-CM

## 2022-05-03 DIAGNOSIS — R0789 Other chest pain: Secondary | ICD-10-CM

## 2022-05-31 ENCOUNTER — Other Ambulatory Visit: Payer: Self-pay | Admitting: Internal Medicine

## 2022-06-01 ENCOUNTER — Ambulatory Visit: Payer: Commercial Managed Care - HMO | Admitting: Cardiovascular Disease

## 2022-06-22 ENCOUNTER — Telehealth: Payer: Self-pay | Admitting: Internal Medicine

## 2022-06-22 ENCOUNTER — Other Ambulatory Visit: Payer: Self-pay

## 2022-06-22 MED ORDER — LEVOTHYROXINE SODIUM 50 MCG PO TABS: 50.0000 ug | ORAL_TABLET | Freq: Every day | ORAL | 0 refills | Status: AC

## 2022-06-22 NOTE — Telephone Encounter (Signed)
Prescription has been sent in for patient

## 2022-06-22 NOTE — Telephone Encounter (Signed)
Pt called wanted Rx Refilled:  levothyroxine (SYNTHROID) 50 MCG tablet   Preferred Pharmacy: [facility], [place]   Phone: [phone]  Fax: [phone]   Call back Number:[phone]

## 2023-02-08 ENCOUNTER — Other Ambulatory Visit: Payer: Self-pay | Admitting: Internal Medicine

## 2023-02-08 DIAGNOSIS — Z1231 Encounter for screening mammogram for malignant neoplasm of breast: Secondary | ICD-10-CM

## 2024-02-23 IMAGING — US US ABDOMEN LIMITED
1 series · 14 of 25 positions shown · non-contrast
Comparison: None Available.

CLINICAL DATA: Right upper quadrant pain.

EXAM:
ULTRASOUND ABDOMEN LIMITED RIGHT UPPER QUADRANT

[Series 1: us abdomen limited ruq (liver/gb) · 14 of 46 slices shown]
[im 1/46]
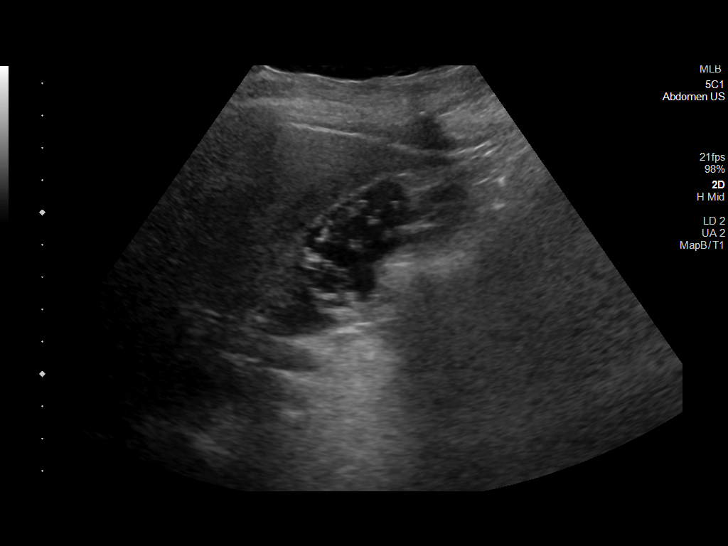
[im 4/46]
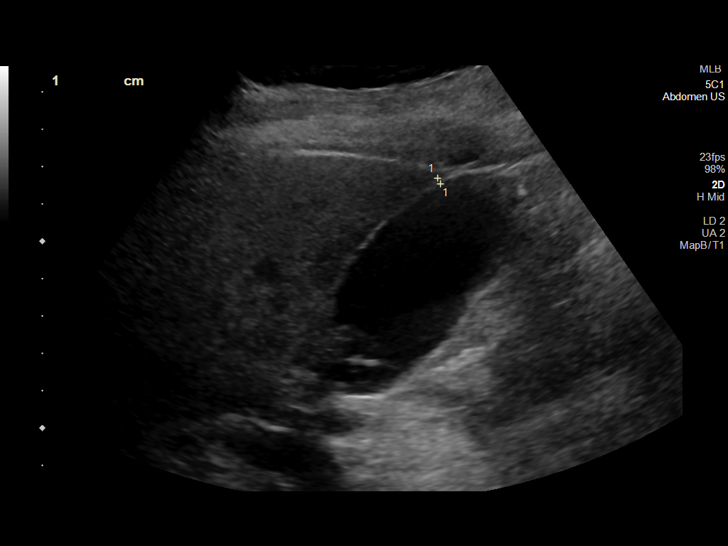
[im 8/46]
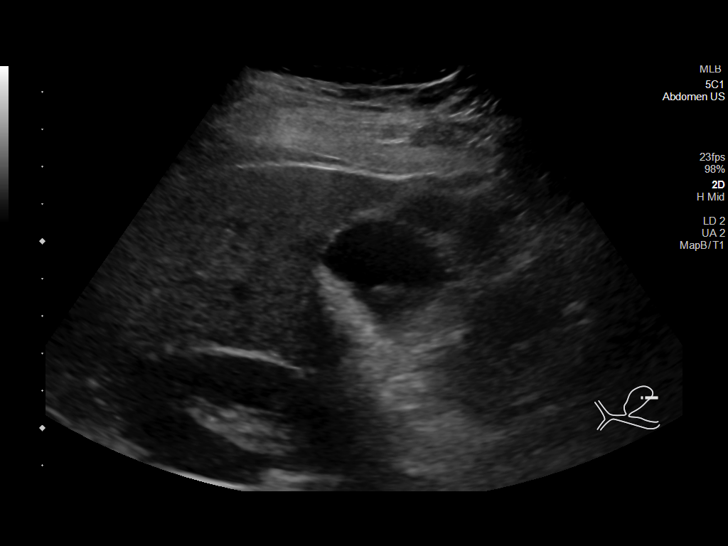
[im 12/46]
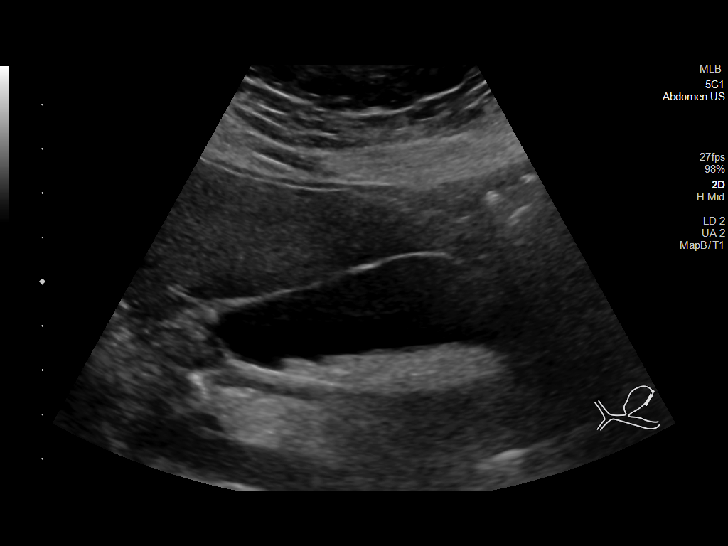
[im 16/46]
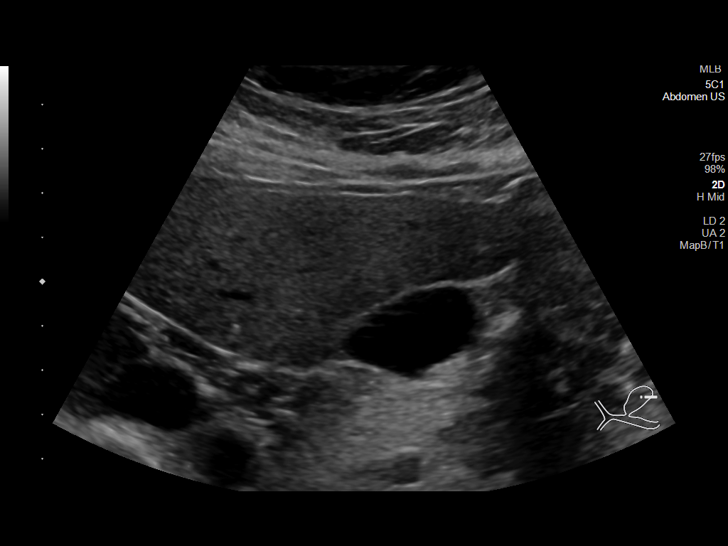
[im 17/46]
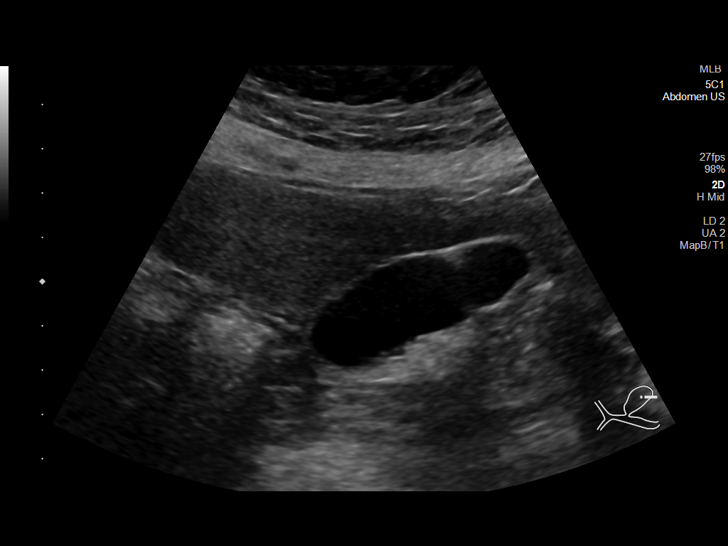
[im 21/46]
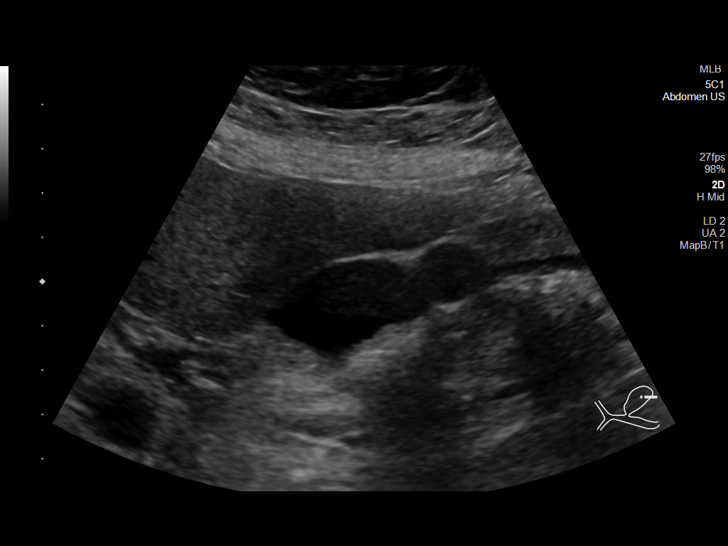
[im 25/46]
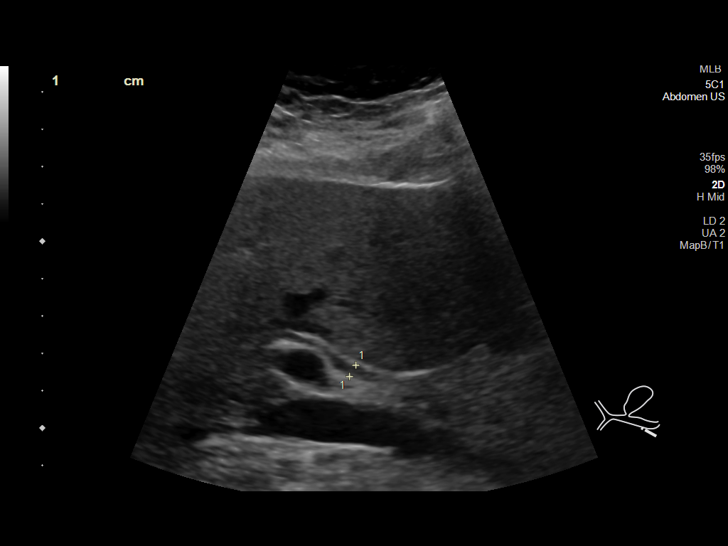
[im 29/46]
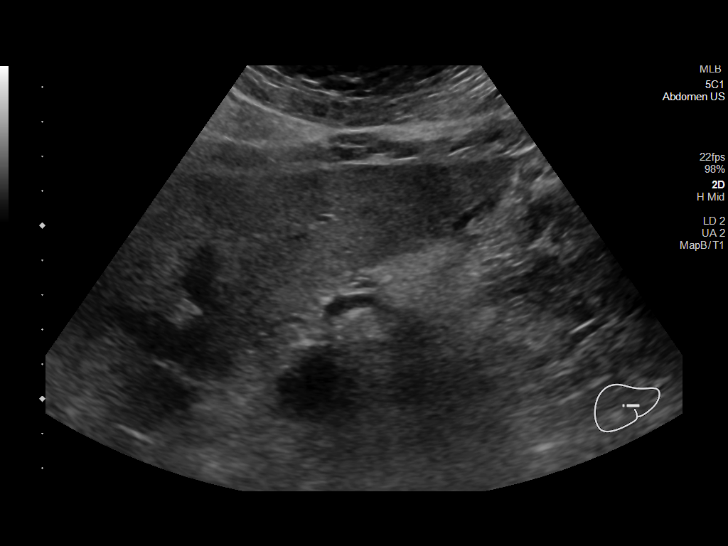
[im 31/46]
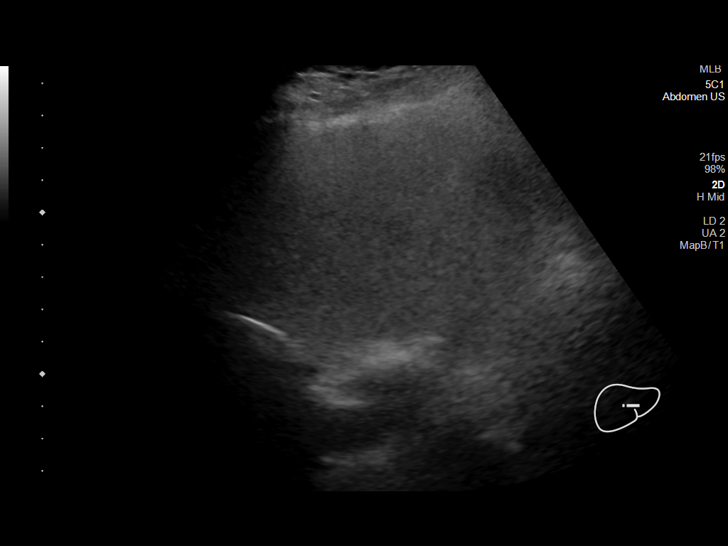
[im 34/46]
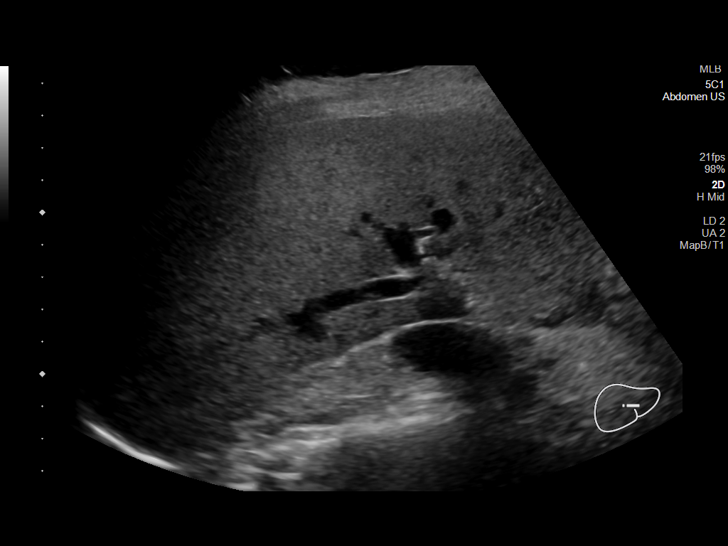
[im 38/46]
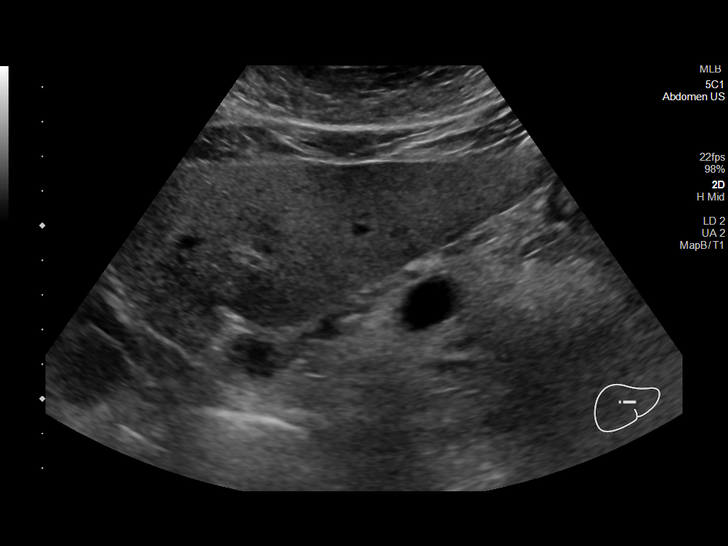
[im 42/46]
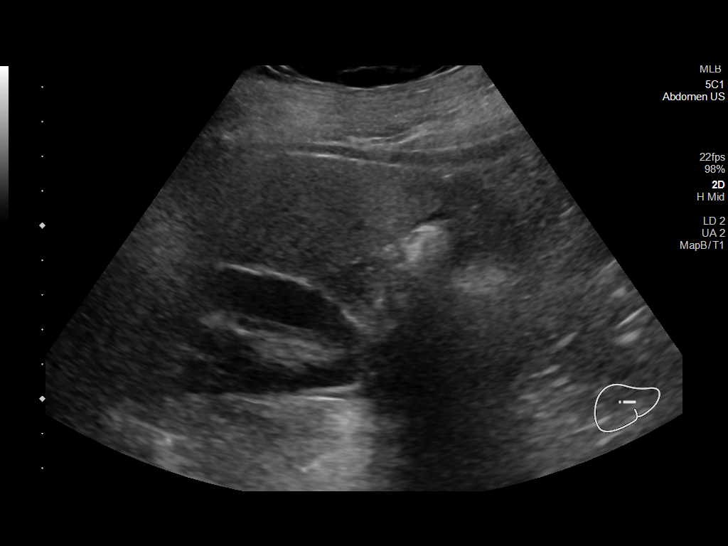
[im 46/46]
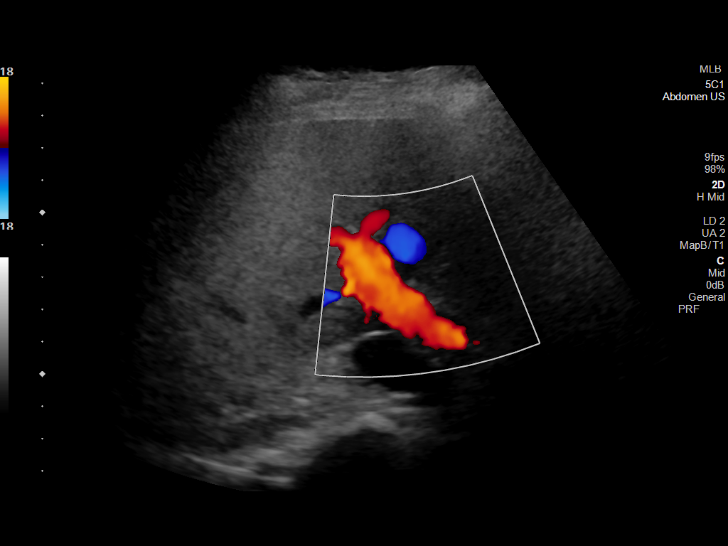

[14 of 25 positions shown; findings below may reference images not displayed]

FINDINGS: Gallbladder:

Sludge in the gallbladder lumen. Small stones in the gallbladder
lumen. No gallbladder wall thickening or pericholecystic fluid.

Common bile duct:

Diameter: 3 mm

Liver:

Increased echogenicity. No focal lesion. Portal vein is patent on
color Doppler imaging with normal direction of blood flow towards
the liver.

Other: None.
IMPRESSION: Cholelithiasis without secondary signs of acute cholecystitis.

Increased hepatic parenchymal echogenicity suggestive of steatosis.
